# Patient Record
Sex: Female | Born: 1960 | Race: White | Hispanic: No | Marital: Married | Smoking: Never smoker
Health system: Southern US, Community
[De-identification: ages and names within clinical notes are randomized; demographics above are authoritative.]

## PROBLEM LIST (undated history)

## (undated) DIAGNOSIS — B880 Other acariasis: Secondary | ICD-10-CM

## (undated) DIAGNOSIS — M1990 Unspecified osteoarthritis, unspecified site: Secondary | ICD-10-CM

## (undated) DIAGNOSIS — T7840XA Allergy, unspecified, initial encounter: Secondary | ICD-10-CM

## (undated) DIAGNOSIS — E785 Hyperlipidemia, unspecified: Secondary | ICD-10-CM

## (undated) DIAGNOSIS — I7300 Raynaud's syndrome without gangrene: Secondary | ICD-10-CM

## (undated) HISTORY — PX: OTHER SURGICAL HISTORY: SHX169

## (undated) HISTORY — DX: Raynaud's syndrome without gangrene: I73.00

## (undated) HISTORY — PX: DILATION AND CURETTAGE OF UTERUS: SHX78

## (undated) HISTORY — DX: Allergy, unspecified, initial encounter: T78.40XA

## (undated) HISTORY — DX: Hyperlipidemia, unspecified: E78.5

## (undated) HISTORY — PX: WISDOM TOOTH EXTRACTION: SHX21

---

## 1998-08-24 ENCOUNTER — Other Ambulatory Visit: Admission: RE | Admit: 1998-08-24 | Discharge: 1998-08-24 | Payer: Self-pay | Admitting: Gynecology

## 1999-12-06 ENCOUNTER — Other Ambulatory Visit: Admission: RE | Admit: 1999-12-06 | Discharge: 1999-12-06 | Payer: Self-pay | Admitting: Gynecology

## 2000-12-18 ENCOUNTER — Other Ambulatory Visit: Admission: RE | Admit: 2000-12-18 | Discharge: 2000-12-18 | Payer: Self-pay | Admitting: Gynecology

## 2001-12-08 ENCOUNTER — Other Ambulatory Visit: Admission: RE | Admit: 2001-12-08 | Discharge: 2001-12-08 | Payer: Self-pay | Admitting: Gynecology

## 2002-10-21 ENCOUNTER — Other Ambulatory Visit: Admission: RE | Admit: 2002-10-21 | Discharge: 2002-10-21 | Payer: Self-pay | Admitting: *Deleted

## 2003-09-19 ENCOUNTER — Other Ambulatory Visit: Admission: RE | Admit: 2003-09-19 | Discharge: 2003-09-19 | Payer: Self-pay | Admitting: *Deleted

## 2004-10-03 ENCOUNTER — Other Ambulatory Visit: Admission: RE | Admit: 2004-10-03 | Discharge: 2004-10-03 | Payer: Self-pay | Admitting: *Deleted

## 2005-12-04 ENCOUNTER — Other Ambulatory Visit: Admission: RE | Admit: 2005-12-04 | Discharge: 2005-12-04 | Payer: Self-pay | Admitting: *Deleted

## 2006-01-27 ENCOUNTER — Other Ambulatory Visit: Admission: RE | Admit: 2006-01-27 | Discharge: 2006-01-27 | Payer: Self-pay | Admitting: *Deleted

## 2007-01-22 ENCOUNTER — Other Ambulatory Visit: Admission: RE | Admit: 2007-01-22 | Discharge: 2007-01-22 | Payer: Self-pay | Admitting: *Deleted

## 2009-03-29 ENCOUNTER — Ambulatory Visit (HOSPITAL_BASED_OUTPATIENT_CLINIC_OR_DEPARTMENT_OTHER): Admission: RE | Admit: 2009-03-29 | Discharge: 2009-03-29 | Payer: Self-pay | Admitting: Orthopedic Surgery

## 2010-10-26 LAB — POCT PREGNANCY, URINE: Preg Test, Ur: NEGATIVE

## 2010-10-26 LAB — POCT HEMOGLOBIN-HEMACUE: Hemoglobin: 13.8 g/dL (ref 12.0–15.0)

## 2011-09-12 ENCOUNTER — Ambulatory Visit (AMBULATORY_SURGERY_CENTER): Payer: PRIVATE HEALTH INSURANCE | Admitting: *Deleted

## 2011-09-12 VITALS — Ht 64.5 in | Wt 160.0 lb

## 2011-09-12 DIAGNOSIS — Z1211 Encounter for screening for malignant neoplasm of colon: Secondary | ICD-10-CM

## 2011-09-12 MED ORDER — PEG-KCL-NACL-NASULF-NA ASC-C 100 G PO SOLR: ORAL | Status: DC

## 2011-09-26 ENCOUNTER — Encounter: Payer: Self-pay | Admitting: Gastroenterology

## 2011-10-15 ENCOUNTER — Encounter: Payer: Self-pay | Admitting: Gastroenterology

## 2011-10-15 ENCOUNTER — Ambulatory Visit (AMBULATORY_SURGERY_CENTER): Payer: PRIVATE HEALTH INSURANCE | Admitting: Gastroenterology

## 2011-10-15 VITALS — BP 148/90 | HR 79 | Temp 98.1°F | Resp 18 | Ht 64.5 in | Wt 160.0 lb

## 2011-10-15 DIAGNOSIS — D126 Benign neoplasm of colon, unspecified: Secondary | ICD-10-CM

## 2011-10-15 DIAGNOSIS — K5730 Diverticulosis of large intestine without perforation or abscess without bleeding: Secondary | ICD-10-CM

## 2011-10-15 DIAGNOSIS — Z1211 Encounter for screening for malignant neoplasm of colon: Secondary | ICD-10-CM

## 2011-10-15 HISTORY — PX: COLONOSCOPY: SHX174

## 2011-10-15 MED ORDER — SODIUM CHLORIDE 0.9 % IV SOLN: 500.0000 mL | INTRAVENOUS | Status: DC

## 2011-10-15 NOTE — Op Note (Signed)
[facility] [address] [place], [place]  [address]  COLONOSCOPY PROCEDURE REPORT  PATIENT:  Amy Beck, Amy Beck  MR#:  [id number] BIRTHDATE:  1960-10-29, 50 yrs. old  GENDER:  female ENDOSCOPIST:  Barbette Hair. Arlyce Dice, MD REF. BY: PROCEDURE DATE:  10/15/2011 PROCEDURE:  Colonoscopy with snare polypectomy ASA CLASS:  Class I INDICATIONS:  Routine Risk Screening MEDICATIONS:   MAC sedation, administered by CRNA propofol 300mg IV  DESCRIPTION OF PROCEDURE:   After the risks benefits and alternatives of the procedure were thoroughly explained, informed consent was obtained.  Digital rectal exam was performed and revealed no abnormalities.   The LB 180AL [id number] endoscope was introduced through the anus and advanced to the cecum, which was identified by both the appendix and ileocecal valve, without limitations.  The quality of the prep was excellent, using MoviPrep.  The instrument was then slowly withdrawn as the colon was fully examined. <<PROCEDUREIMAGES>>  FINDINGS:  A sessile polyp was found in the cecum. It was 2 mm in size. Polyp was snared without cautery. Retrieval was successful (see image3). snare polyp  Mild diverticulosis was found in the descending colon (see image4).  This was otherwise a normal examination of the colon (see image1, image2, and image5). Retroflexed views in the rectum revealed no abnormalities.    The time to cecum =  1) 3.0  minutes. The scope was then withdrawn in 1) 9.0  minutes from the cecum and the procedure completed. COMPLICATIONS:  None ENDOSCOPIC IMPRESSION: 1) 2 mm sessile polyp in the cecum 2) Mild diverticulosis in the descending colon 3) Otherwise normal examination RECOMMENDATIONS:  1) If the polyp(s) removed today are proven to be adenomatous (pre-cancerous) polyps, you will need a repeat colonoscopy in 5 years. Otherwise you should continue to follow colorectal cancer screening guidelines for "routine risk" patients with  colonosc opy in 10 years. You will receive a letter within 1-2 weeks with the results of your biopsy as well as final recommendations. Please call my office if you have not received a letter after 3 weeks. REPEAT EXAM:  You will receive a letter from Dr. Arlyce Dice in 1-2 weeks, after reviewing the final pathology, with followup recommendations.  ______________________________ Barbette Hair Arlyce Dice, MD  CC:  Morton Stall, MD  n. Rosalie Doctor:   Barbette Hair. Jaylon Boylen at 10/15/2011 09:25 AM  Sindy Guadeloupe, [id number]

## 2011-10-15 NOTE — Patient Instructions (Signed)
YOU HAD AN ENDOSCOPIC PROCEDURE TODAY AT THE [facility]: Refer to the procedure report that was given to you for any specific questions about what was found during the examination.  If the procedure report does not answer your questions, please call your gastroenterologist to clarify.  If you requested that your care partner not be given the details of your procedure findings, then the procedure report has been included in a sealed envelope for you to review at your convenience later.  YOU SHOULD EXPECT: Some feelings of bloating in the abdomen. Passage of more gas than usual.  Walking can help get rid of the air that was put into your GI tract during the procedure and reduce the bloating. If you had a lower endoscopy (such as a colonoscopy or flexible sigmoidoscopy) you may notice spotting of blood in your stool or on the toilet paper. If you underwent a bowel prep for your procedure, then you may not have a normal bowel movement for a few days.  DIET: Your first meal following the procedure should be a light meal and then it is ok to progress to your normal diet.  A half-sandwich or bowl of soup is an example of a good first meal.  Heavy or fried foods are harder to digest and may make you feel nauseous or bloated.  Likewise meals heavy in dairy and vegetables can cause extra gas to form and this can also increase the bloating.  Drink plenty of fluids but you should avoid alcoholic beverages for 24 hours.  ACTIVITY: Your care partner should take you home directly after the procedure.  You should plan to take it easy, moving slowly for the rest of the day.  You can resume normal activity the day after the procedure however you should NOT DRIVE or use heavy machinery for 24 hours (because of the sedation medicines used during the test).    SYMPTOMS TO REPORT IMMEDIATELY: A gastroenterologist can be reached at any hour.  During normal business hours, 8:30 AM to 5:00 PM Monday through Friday,  call [phone].  After hours and on weekends, please call the GI answering service at [phone] who will take a message and have the physician on call contact you.   Following lower endoscopy (colonoscopy or flexible sigmoidoscopy):  Excessive amounts of blood in the stool  Significant tenderness or worsening of abdominal pains  Swelling of the abdomen that is new, acute  Fever of 100F or higher    FOLLOW UP: If any biopsies were taken you will be contacted by phone or by letter within the next 1-3 weeks.  Call your gastroenterologist if you have not heard about the biopsies in 3 weeks.  Our staff will call the home number listed on your records the next business day following your procedure to check on you and address any questions or concerns that you may have at that time regarding the information given to you following your procedure. This is a courtesy call and so if there is no answer at the home number and we have not heard from you through the emergency physician on call, we will assume that you have returned to your regular daily activities without incident.  SIGNATURES/CONFIDENTIALITY: You and/or your care partner have signed paperwork which will be entered into your electronic medical record.  These signatures attest to the fact that that the information above on your After Visit Summary has been reviewed and is understood.  Full responsibility of the confidentiality   of this discharge information lies with you and/or your care-partner.     

## 2011-10-15 NOTE — Progress Notes (Signed)
Patient did not experience any of the following events: a burn prior to discharge; a fall within the facility; wrong site/side/patient/procedure/implant event; or a hospital transfer or hospital admission upon discharge from the facility. (G8907) Patient did not have preoperative order for IV antibiotic SSI prophylaxis. (G8918)  

## 2011-10-16 ENCOUNTER — Telehealth: Payer: Self-pay | Admitting: *Deleted

## 2011-10-16 NOTE — Telephone Encounter (Signed)
  Follow up Call-  Call back number 10/15/2011  Post procedure Call Back phone  # [phone]  Permission to leave phone message Yes     Patient questions:  Do you have a fever, pain , or abdominal swelling? no Pain Score  0 *  Have you tolerated food without any problems? yes  Have you been able to return to your normal activities? yes  Do you have any questions about your discharge instructions: Diet   no Medications  no Follow up visit  no  Do you have questions or concerns about your Care? no  Actions: * If pain score is 4 or above: No action needed, pain <4.

## 2011-10-23 ENCOUNTER — Encounter: Payer: Self-pay | Admitting: Gastroenterology

## 2014-08-31 NOTE — H&P (Signed)
TOTAL HIP ADMISSION H&P  Patient is admitted for right total hip arthroplasty, anterior approach.  Subjective:  Chief Complaint: Right hip primary OA / pain  HPI: Amy Beck, 54 y.o. female, has a history of pain and functional disability in the right hip(s) due to arthritis and patient has failed non-surgical conservative treatments for greater than 12 weeks to include NSAID's and/or analgesics, use of assistive devices and activity modification.  Onset of symptoms was gradual starting 2+ years ago with gradually worsening course since that time.The patient noted no past surgery on the right hip(s).  Patient currently rates pain in the right hip at 10 out of 10 with activity. Patient has night pain, worsening of pain with activity and weight bearing, trendelenberg gait, pain that interfers with activities of daily living, pain with passive range of motion and crepitus. Patient has evidence of periarticular osteophytes and joint space narrowing by imaging studies. This condition presents safety issues increasing the risk of falls.  There is no current active infection.   Risks, benefits and expectations were discussed with the patient.  Risks including but not limited to the risk of anesthesia, blood clots, nerve damage, blood vessel damage, failure of the prosthesis, infection and up to and including death.  Patient understand the risks, benefits and expectations and wishes to proceed with surgery.   PCP: Hermelinda Dellen, MD  D/C Plans:      Home with HHPT  Post-op Meds:       No Rx given   Tranexamic Acid:      To be given - IV  Decadron:      Is to be given  FYI:     ASA post-op  Norco post-op     Past Medical History  Diagnosis Date  . Allergy   . Asthma   . Hyperlipidemia   . Raynaud's disease     Past Surgical History  Procedure Laterality Date  . Knee arthroscopy      both knees  . Dilation and curettage of uterus      No prescriptions prior to admission   Allergies   Allergen Reactions  . Wellbutrin [Bupropion Hcl]     headaches    History  Substance Use Topics  . Smoking status: Never Smoker   . Smokeless tobacco: Never Used  . Alcohol Use: 8.4 oz/week    14 Glasses of wine per week    Family History  Problem Relation Age of Onset  . Esophageal cancer Father   . Pancreatic cancer Paternal Uncle   . Colon cancer Neg Hx   . Stomach cancer Neg Hx   . Rectal cancer Neg Hx      Review of Systems  Constitutional: Negative.   HENT: Negative.   Eyes: Negative.   Respiratory: Negative.   Cardiovascular: Negative.   Gastrointestinal: Negative.   Genitourinary: Negative.   Musculoskeletal: Positive for joint pain.  Skin: Negative.   Neurological: Negative.   Endo/Heme/Allergies: Positive for environmental allergies.  Psychiatric/Behavioral: Negative.     Objective:  Physical Exam  Constitutional: She is oriented to person, place, and time. She appears well-developed and well-nourished.  HENT:  Head: Normocephalic and atraumatic.  Eyes: Pupils are equal, round, and reactive to light.  Neck: Neck supple. No JVD present. No tracheal deviation present. No thyromegaly present.  Cardiovascular: Normal rate, regular rhythm, normal heart sounds and intact distal pulses.   Respiratory: Effort normal and breath sounds normal. No stridor. No respiratory distress. She has no wheezes.  GI: Soft. There is no tenderness. There is no guarding.  Musculoskeletal:       Right hip: She exhibits decreased range of motion, decreased strength, tenderness and bony tenderness. She exhibits no swelling, no deformity and no laceration.  Lymphadenopathy:    She has no cervical adenopathy.  Neurological: She is alert and oriented to person, place, and time.  Skin: Skin is warm and dry.  Psychiatric: She has a normal mood and affect.      Labs:  Estimated body mass index is 27.05 kg/(m^2) as calculated from the following:   Height as of 10/15/11: 5' 4.5"  (1.638 m).   Weight as of 10/15/11: 72.576 kg (160 lb).   Imaging Review Plain radiographs demonstrate severe degenerative joint disease of the right hip(s). The bone quality appears to be good for age and reported activity level.  Assessment/Plan:  End stage arthritis, right hip(s)  The patient history, physical examination, clinical judgement of the provider and imaging studies are consistent with end stage degenerative joint disease of the right hip(s) and total hip arthroplasty is deemed medically necessary. The treatment options including medical management, injection therapy, arthroscopy and arthroplasty were discussed at length. The risks and benefits of total hip arthroplasty were presented and reviewed. The risks due to aseptic loosening, infection, stiffness, dislocation/subluxation,  thromboembolic complications and other imponderables were discussed.  The patient acknowledged the explanation, agreed to proceed with the plan and consent was signed. Patient is being admitted for inpatient treatment for surgery, pain control, PT, OT, prophylactic antibiotics, VTE prophylaxis, progressive ambulation and ADL's and discharge planning.The patient is planning to be discharged home with home health services.     [place] Pugh Analiese Krupka   PA-C  08/31/2014, 12:21 PM

## 2014-09-05 NOTE — Patient Instructions (Addendum)
Amy Beck  09/05/2014   Your procedure is scheduled on:09/13/2014     Report to [facility] Main  Entrance and follow signs to               [place] at     0700 AM.  Call this number if you have problems the morning of surgery [phone]   Remember:  Do not eat food or drink liquids :After Midnight.     Take these medicines the morning of surgery with A SIP OF WATER: none                                You may not have any metal on your body including hair pins and              piercings  Do not wear jewelry,  lotions, powders or perfumes., deodorant.              Do not wear nail polish.  Do not shave  48 hours prior to surgery.     Do not bring valuables to the hospital. [place].  Contacts, dentures or bridgework may not be worn into surgery.  Leave suitcase in the car. After surgery it may be brought to your room.        Special Instructions: coughing and deep breathing exercises, leg exercises               Please read over the following fact sheets you were given: _____________________________________________________________________             [facility] - Preparing for Surgery Before surgery, you can play an important role.  Because skin is not sterile, your skin needs to be as free of germs as possible.  You can reduce the number of germs on your skin by washing with CHG (chlorahexidine gluconate) soap before surgery.  CHG is an antiseptic cleaner which kills germs and bonds with the skin to continue killing germs even after washing. Please DO NOT use if you have an allergy to CHG or antibacterial soaps.  If your skin becomes reddened/irritated stop using the CHG and inform your nurse when you arrive at Short Stay. Do not shave (including legs and underarms) for at least 48 hours prior to the first CHG shower.  You may shave your face/neck. Please follow these instructions  carefully:  1.  Shower with CHG Soap the night before surgery and the  morning of Surgery.  2.  If you choose to wash your hair, wash your hair first as usual with your  normal  shampoo.  3.  After you shampoo, rinse your hair and body thoroughly to remove the  shampoo.                           4.  Use CHG as you would any other liquid soap.  You can apply chg directly  to the skin and wash                       Gently with a scrungie or clean washcloth.  5.  Apply the CHG Soap to your body ONLY FROM THE NECK DOWN.  Do not use on face/ open                           Wound or open sores. Avoid contact with eyes, ears mouth and genitals (private parts).                       Wash face,  Genitals (private parts) with your normal soap.             6.  Wash thoroughly, paying special attention to the area where your surgery  will be performed.  7.  Thoroughly rinse your body with warm water from the neck down.  8.  DO NOT shower/wash with your normal soap after using and rinsing off  the CHG Soap.                9.  Pat yourself dry with a clean towel.            10.  Wear clean pajamas.            11.  Place clean sheets on your bed the night of your first shower and do not  sleep with pets. Day of Surgery : Do not apply any lotions/deodorants the morning of surgery.  Please wear clean clothes to the hospital/surgery center.  FAILURE TO FOLLOW THESE INSTRUCTIONS MAY RESULT IN THE CANCELLATION OF YOUR SURGERY PATIENT SIGNATURE_________________________________  NURSE SIGNATURE__________________________________  ________________________________________________________________________  WHAT IS A BLOOD TRANSFUSION? Blood Transfusion Information  A transfusion is the replacement of blood or some of its parts. Blood is made up of multiple cells which provide different functions.  Red blood cells carry oxygen and are used for blood loss replacement.  White blood cells fight against  infection.  Platelets control bleeding.  Plasma helps clot blood.  Other blood products are available for specialized needs, such as hemophilia or other clotting disorders. BEFORE THE TRANSFUSION  Who gives blood for transfusions?   Healthy volunteers who are fully evaluated to make sure their blood is safe. This is blood bank blood. Transfusion therapy is the safest it has ever been in the practice of medicine. Before blood is taken from a donor, a complete history is taken to make sure that person has no history of diseases nor engages in risky social behavior (examples are intravenous drug use or sexual activity with multiple partners). The donor's travel history is screened to minimize risk of transmitting infections, such as malaria. The donated blood is tested for signs of infectious diseases, such as HIV and hepatitis. The blood is then tested to be sure it is compatible with you in order to minimize the chance of a transfusion reaction. If you or a relative donates blood, this is often done in anticipation of surgery and is not appropriate for emergency situations. It takes many days to process the donated blood. RISKS AND COMPLICATIONS Although transfusion therapy is very safe and saves many lives, the main dangers of transfusion include:  1. Getting an infectious disease. 2. Developing a transfusion reaction. This is an allergic reaction to something in the blood you were given. Every precaution is taken to prevent this. The decision to have a blood transfusion has been considered carefully by your caregiver before blood is given. Blood is not given unless the benefits outweigh the risks. AFTER THE TRANSFUSION  Right after receiving a blood transfusion, you will usually feel much better and more energetic. This is especially  true if your red blood cells have gotten low (anemic). The transfusion raises the level of the red blood cells which carry oxygen, and this usually causes an energy  increase.  The nurse administering the transfusion will monitor you carefully for complications. HOME CARE INSTRUCTIONS  No special instructions are needed after a transfusion. You may find your energy is better. Speak with your caregiver about any limitations on activity for underlying diseases you may have. SEEK MEDICAL CARE IF:   Your condition is not improving after your transfusion.  You develop redness or irritation at the intravenous (IV) site. SEEK IMMEDIATE MEDICAL CARE IF:  Any of the following symptoms occur over the next 12 hours:  Shaking chills.  You have a temperature by mouth above 102 F (38.9 C), not controlled by medicine.  Chest, back, or muscle pain.  People around you feel you are not acting correctly or are confused.  Shortness of breath or difficulty breathing.  Dizziness and fainting.  You get a rash or develop hives.  You have a decrease in urine output.  Your urine turns a dark color or changes to pink, red, or brown. Any of the following symptoms occur over the next 10 days:  You have a temperature by mouth above 102 F (38.9 C), not controlled by medicine.  Shortness of breath.  Weakness after normal activity.  The white part of the eye turns yellow (jaundice).  You have a decrease in the amount of urine or are urinating less often.  Your urine turns a dark color or changes to pink, red, or brown. Document Released: 07/05/2000 Document Revised: 09/30/2011 Document Reviewed: 02/22/2008 [employer] Patient Information 2014 [place].  _______________________________________________________________________  Incentive Spirometer  An incentive spirometer is a tool that can help keep your lungs clear and active. This tool measures how well you are filling your lungs with each breath. Taking long deep breaths may help reverse or decrease the chance of developing breathing (pulmonary) problems (especially infection) following:  A long  period of time when you are unable to move or be active. BEFORE THE PROCEDURE   If the spirometer includes an indicator to show your best effort, your nurse or respiratory therapist will set it to a desired goal.  If possible, sit up straight or lean slightly forward. Try not to slouch.  Hold the incentive spirometer in an upright position. INSTRUCTIONS FOR USE  3. Sit on the edge of your bed if possible, or sit up as far as you can in bed or on a chair. 4. Hold the incentive spirometer in an upright position. 5. Breathe out normally. 6. Place the mouthpiece in your mouth and seal your lips tightly around it. 7. Breathe in slowly and as deeply as possible, raising the piston or the ball toward the top of the column. 8. Hold your breath for 3-5 seconds or for as long as possible. Allow the piston or ball to fall to the bottom of the column. 9. Remove the mouthpiece from your mouth and breathe out normally. 10. Rest for a few seconds and repeat Steps 1 through 7 at least 10 times every 1-2 hours when you are awake. Take your time and take a few normal breaths between deep breaths. 11. The spirometer may include an indicator to show your best effort. Use the indicator as a goal to work toward during each repetition. 12. After each set of 10 deep breaths, practice coughing to be sure your lungs are clear. If you have  an incision (the cut made at the time of surgery), support your incision when coughing by placing a pillow or rolled up towels firmly against it. Once you are able to get out of bed, walk around indoors and cough well. You may stop using the incentive spirometer when instructed by your caregiver.  RISKS AND COMPLICATIONS  Take your time so you do not get dizzy or light-headed.  If you are in pain, you may need to take or ask for pain medication before doing incentive spirometry. It is harder to take a deep breath if you are having pain. AFTER USE  Rest and breathe slowly and  easily.  It can be helpful to keep track of a log of your progress. Your caregiver can provide you with a simple table to help with this. If you are using the spirometer at home, follow these instructions: [place] IF:   You are having difficultly using the spirometer.  You have trouble using the spirometer as often as instructed.  Your pain medication is not giving enough relief while using the spirometer.  You develop fever of 100.5 F (38.1 C) or higher. SEEK IMMEDIATE MEDICAL CARE IF:   You cough up bloody sputum that had not been present before.  You develop fever of 102 F (38.9 C) or greater.  You develop worsening pain at or near the incision site. MAKE SURE YOU:   Understand these instructions.  Will watch your condition.  Will get help right away if you are not doing well or get worse. Document Released: 11/18/2006 Document Revised: 09/30/2011 Document Reviewed: 01/19/2007 [facility] Patient Information 2014 [place], [place].   ________________________________________________________________________

## 2014-09-06 ENCOUNTER — Encounter (HOSPITAL_COMMUNITY)
Admission: RE | Admit: 2014-09-06 | Discharge: 2014-09-06 | Disposition: A | Discharge status: Home / Self Care | Payer: BLUE CROSS/BLUE SHIELD | Source: Ambulatory Visit | Attending: Orthopedic Surgery | Admitting: Orthopedic Surgery

## 2014-09-06 ENCOUNTER — Encounter (HOSPITAL_COMMUNITY): Payer: Self-pay

## 2014-09-06 DIAGNOSIS — Z01818 Encounter for other preprocedural examination: Secondary | ICD-10-CM | POA: Insufficient documentation

## 2014-09-06 DIAGNOSIS — M1611 Unilateral primary osteoarthritis, right hip: Secondary | ICD-10-CM | POA: Diagnosis not present

## 2014-09-06 HISTORY — DX: Unspecified osteoarthritis, unspecified site: M19.90

## 2014-09-06 LAB — URINALYSIS, ROUTINE W REFLEX MICROSCOPIC
Bilirubin Urine: NEGATIVE
GLUCOSE, UA: NEGATIVE mg/dL
Hgb urine dipstick: NEGATIVE
KETONES UR: NEGATIVE mg/dL
LEUKOCYTES UA: NEGATIVE
Nitrite: NEGATIVE
PH: 7 (ref 5.0–8.0)
PROTEIN: NEGATIVE mg/dL
Specific Gravity, Urine: 1.006 (ref 1.005–1.030)
Urobilinogen, UA: 0.2 mg/dL (ref 0.0–1.0)

## 2014-09-06 LAB — CBC
HEMATOCRIT: 45.6 % (ref 36.0–46.0)
HEMOGLOBIN: 15.1 g/dL — AB (ref 12.0–15.0)
MCH: 31.5 pg (ref 26.0–34.0)
MCHC: 33.1 g/dL (ref 30.0–36.0)
MCV: 95 fL (ref 78.0–100.0)
Platelets: 317 10*3/uL (ref 150–400)
RBC: 4.8 MIL/uL (ref 3.87–5.11)
RDW: 13.3 % (ref 11.5–15.5)
WBC: 6.9 10*3/uL (ref 4.0–10.5)

## 2014-09-06 LAB — BASIC METABOLIC PANEL
Anion gap: 9 (ref 5–15)
BUN: 16 mg/dL (ref 6–23)
CO2: 27 mmol/L (ref 19–32)
Calcium: 10 mg/dL (ref 8.4–10.5)
Chloride: 100 mmol/L (ref 96–112)
Creatinine, Ser: 0.6 mg/dL (ref 0.50–1.10)
GFR calc Af Amer: >90 mL/min (ref >90)
GFR calc non Af Amer: >90 mL/min (ref >90)
GLUCOSE: 98 mg/dL (ref 70–99)
POTASSIUM: 4.4 mmol/L (ref 3.5–5.1)
SODIUM: 136 mmol/L (ref 135–145)

## 2014-09-06 LAB — PROTIME-INR
INR: 0.93 (ref 0.00–1.49)
Prothrombin Time: 12.6 seconds (ref 11.6–15.2)

## 2014-09-06 LAB — SURGICAL PCR SCREEN
MRSA, PCR: NEGATIVE
STAPHYLOCOCCUS AUREUS: NEGATIVE

## 2014-09-06 LAB — APTT: APTT: 32 s (ref 24–37)

## 2014-09-06 NOTE — Progress Notes (Signed)
Clearance note from Dr Marveen Reeks on chart- [place] 10/08/13 on chart

## 2014-09-13 ENCOUNTER — Inpatient Hospital Stay (HOSPITAL_COMMUNITY): Payer: BLUE CROSS/BLUE SHIELD | Admitting: Anesthesiology

## 2014-09-13 ENCOUNTER — Inpatient Hospital Stay (HOSPITAL_COMMUNITY)
Admission: RE | Admit: 2014-09-13 | Discharge: 2014-09-14 | DRG: 470 | Disposition: A | Discharge status: Home Health | Payer: BLUE CROSS/BLUE SHIELD | Source: Ambulatory Visit | Attending: Orthopedic Surgery | Admitting: Orthopedic Surgery

## 2014-09-13 ENCOUNTER — Inpatient Hospital Stay (HOSPITAL_COMMUNITY): Payer: BLUE CROSS/BLUE SHIELD

## 2014-09-13 ENCOUNTER — Encounter (HOSPITAL_COMMUNITY): Payer: Self-pay | Admitting: *Deleted

## 2014-09-13 ENCOUNTER — Encounter (HOSPITAL_COMMUNITY)
Admission: RE | Disposition: A | Discharge status: Home Health | Payer: Self-pay | Source: Ambulatory Visit | Attending: Orthopedic Surgery

## 2014-09-13 DIAGNOSIS — E663 Overweight: Secondary | ICD-10-CM | POA: Diagnosis present

## 2014-09-13 DIAGNOSIS — Z6825 Body mass index (BMI) 25.0-25.9, adult: Secondary | ICD-10-CM | POA: Diagnosis not present

## 2014-09-13 DIAGNOSIS — M25551 Pain in right hip: Secondary | ICD-10-CM | POA: Diagnosis present

## 2014-09-13 DIAGNOSIS — Z01812 Encounter for preprocedural laboratory examination: Secondary | ICD-10-CM | POA: Diagnosis not present

## 2014-09-13 DIAGNOSIS — Z96649 Presence of unspecified artificial hip joint: Secondary | ICD-10-CM

## 2014-09-13 DIAGNOSIS — M1611 Unilateral primary osteoarthritis, right hip: Secondary | ICD-10-CM | POA: Diagnosis present

## 2014-09-13 HISTORY — PX: TOTAL HIP ARTHROPLASTY: SHX124

## 2014-09-13 LAB — TYPE AND SCREEN
ABO/RH(D): O POS
Antibody Screen: NEGATIVE

## 2014-09-13 LAB — ABO/RH: ABO/RH(D): O POS

## 2014-09-13 SURGERY — ARTHROPLASTY, HIP, TOTAL, ANTERIOR APPROACH
Anesthesia: Spinal | Site: Hip | Laterality: Right

## 2014-09-13 MED ORDER — DEXAMETHASONE SODIUM PHOSPHATE 10 MG/ML IJ SOLN
10.0000 mg | Freq: Once | INTRAMUSCULAR | Status: AC
  Administered 2014-09-13: 10 mg via INTRAVENOUS

## 2014-09-13 MED ORDER — FENTANYL CITRATE 0.05 MG/ML IJ SOLN
INTRAMUSCULAR | Status: AC
  Filled 2014-09-13: qty 2

## 2014-09-13 MED ORDER — DEXAMETHASONE SODIUM PHOSPHATE 10 MG/ML IJ SOLN
INTRAMUSCULAR | Status: AC
  Filled 2014-09-13: qty 1

## 2014-09-13 MED ORDER — MIDAZOLAM HCL 5 MG/5ML IJ SOLN
INTRAMUSCULAR | Status: DC | PRN
  Administered 2014-09-13 (×2): 1 mg via INTRAVENOUS
  Administered 2014-09-13: 2 mg via INTRAVENOUS

## 2014-09-13 MED ORDER — PROPOFOL 10 MG/ML IV BOLUS
INTRAVENOUS | Status: AC
  Filled 2014-09-13: qty 20

## 2014-09-13 MED ORDER — HYDROMORPHONE HCL 1 MG/ML IJ SOLN: 0.2500 mg | INTRAMUSCULAR | Status: DC | PRN

## 2014-09-13 MED ORDER — METHOCARBAMOL 1000 MG/10ML IJ SOLN
500.0000 mg | Freq: Four times a day (QID) | INTRAVENOUS | Status: DC | PRN
  Filled 2014-09-13: qty 5

## 2014-09-13 MED ORDER — LACTATED RINGERS IV SOLN
INTRAVENOUS | Status: DC | PRN
  Administered 2014-09-13 (×3): via INTRAVENOUS

## 2014-09-13 MED ORDER — PROPOFOL 10 MG/ML IV BOLUS
INTRAVENOUS | Status: DC | PRN
  Administered 2014-09-13: 30 mg via INTRAVENOUS

## 2014-09-13 MED ORDER — MENTHOL 3 MG MT LOZG: 1.0000 | LOZENGE | OROMUCOSAL | Status: DC | PRN

## 2014-09-13 MED ORDER — BISACODYL 10 MG RE SUPP: 10.0000 mg | Freq: Every day | RECTAL | Status: DC | PRN

## 2014-09-13 MED ORDER — DEXAMETHASONE SODIUM PHOSPHATE 10 MG/ML IJ SOLN
10.0000 mg | Freq: Once | INTRAMUSCULAR | Status: DC
  Filled 2014-09-13: qty 1

## 2014-09-13 MED ORDER — DIPHENHYDRAMINE HCL 25 MG PO CAPS: 25.0000 mg | ORAL_CAPSULE | Freq: Four times a day (QID) | ORAL | Status: DC | PRN

## 2014-09-13 MED ORDER — PROPOFOL INFUSION 10 MG/ML OPTIME
INTRAVENOUS | Status: DC | PRN
  Administered 2014-09-13: 160 ug/kg/min via INTRAVENOUS

## 2014-09-13 MED ORDER — PHENYLEPHRINE HCL 10 MG/ML IJ SOLN
10.0000 mg | INTRAVENOUS | Status: DC | PRN
  Administered 2014-09-13: 10 ug/min via INTRAVENOUS

## 2014-09-13 MED ORDER — ALUM & MAG HYDROXIDE-SIMETH 200-200-20 MG/5ML PO SUSP
30.0000 mL | ORAL | Status: DC | PRN
  Administered 2014-09-14: 30 mL via ORAL
  Filled 2014-09-13: qty 30

## 2014-09-13 MED ORDER — FENTANYL CITRATE 0.05 MG/ML IJ SOLN
INTRAMUSCULAR | Status: DC | PRN
  Administered 2014-09-13: 100 ug via INTRAVENOUS

## 2014-09-13 MED ORDER — PHENYLEPHRINE 40 MCG/ML (10ML) SYRINGE FOR IV PUSH (FOR BLOOD PRESSURE SUPPORT)
PREFILLED_SYRINGE | INTRAVENOUS | Status: AC
  Filled 2014-09-13: qty 10

## 2014-09-13 MED ORDER — TRAMADOL HCL 50 MG PO TABS
50.0000 mg | ORAL_TABLET | Freq: Four times a day (QID) | ORAL | Status: DC | PRN
  Administered 2014-09-13 – 2014-09-14 (×3): 50 mg via ORAL
  Filled 2014-09-13 (×3): qty 1

## 2014-09-13 MED ORDER — TRANEXAMIC ACID 100 MG/ML IV SOLN
1000.0000 mg | Freq: Once | INTRAVENOUS | Status: AC
  Administered 2014-09-13: 1000 mg via INTRAVENOUS
  Filled 2014-09-13: qty 10

## 2014-09-13 MED ORDER — OXYMETAZOLINE HCL 0.05 % NA SOLN
1.0000 | Freq: Two times a day (BID) | NASAL | Status: DC
  Filled 2014-09-13: qty 15

## 2014-09-13 MED ORDER — SIMVASTATIN 10 MG PO TABS
10.0000 mg | ORAL_TABLET | Freq: Every day | ORAL | Status: DC
  Administered 2014-09-13: 10 mg via ORAL
  Filled 2014-09-13 (×2): qty 1

## 2014-09-13 MED ORDER — CEFAZOLIN SODIUM-DEXTROSE 2-3 GM-% IV SOLR
2.0000 g | INTRAVENOUS | Status: AC
  Administered 2014-09-13: 2 g via INTRAVENOUS

## 2014-09-13 MED ORDER — CELECOXIB 200 MG PO CAPS
200.0000 mg | ORAL_CAPSULE | Freq: Two times a day (BID) | ORAL | Status: DC
  Administered 2014-09-13 – 2014-09-14 (×2): 200 mg via ORAL
  Filled 2014-09-13 (×3): qty 1

## 2014-09-13 MED ORDER — CEFAZOLIN SODIUM-DEXTROSE 2-3 GM-% IV SOLR
INTRAVENOUS | Status: AC
  Filled 2014-09-13: qty 50

## 2014-09-13 MED ORDER — PHENYLEPHRINE HCL 10 MG/ML IJ SOLN
INTRAMUSCULAR | Status: AC
  Filled 2014-09-13: qty 1

## 2014-09-13 MED ORDER — DOCUSATE SODIUM 100 MG PO CAPS
100.0000 mg | ORAL_CAPSULE | Freq: Two times a day (BID) | ORAL | Status: DC
Start: 2014-09-13 — End: 2014-09-14
  Administered 2014-09-13 – 2014-09-14 (×2): 100 mg via ORAL

## 2014-09-13 MED ORDER — ONDANSETRON HCL 4 MG/2ML IJ SOLN: 4.0000 mg | Freq: Four times a day (QID) | INTRAMUSCULAR | Status: DC | PRN

## 2014-09-13 MED ORDER — CHLORHEXIDINE GLUCONATE 4 % EX LIQD: 60.0000 mL | Freq: Once | CUTANEOUS | Status: DC

## 2014-09-13 MED ORDER — PROMETHAZINE HCL 25 MG/ML IJ SOLN: 6.2500 mg | INTRAMUSCULAR | Status: DC | PRN

## 2014-09-13 MED ORDER — PHENYLEPHRINE HCL 10 MG/ML IJ SOLN
INTRAMUSCULAR | Status: DC | PRN
  Administered 2014-09-13: 120 ug via INTRAVENOUS
  Administered 2014-09-13 (×2): 80 ug via INTRAVENOUS

## 2014-09-13 MED ORDER — FERROUS SULFATE 325 (65 FE) MG PO TABS
325.0000 mg | ORAL_TABLET | Freq: Three times a day (TID) | ORAL | Status: DC
  Administered 2014-09-14 (×2): 325 mg via ORAL
  Filled 2014-09-13 (×4): qty 1

## 2014-09-13 MED ORDER — PHENOL 1.4 % MT LIQD: 1.0000 | OROMUCOSAL | Status: DC | PRN

## 2014-09-13 MED ORDER — ONDANSETRON HCL 4 MG PO TABS: 4.0000 mg | ORAL_TABLET | Freq: Four times a day (QID) | ORAL | Status: DC | PRN

## 2014-09-13 MED ORDER — MIDAZOLAM HCL 2 MG/2ML IJ SOLN
INTRAMUSCULAR | Status: AC
Start: 2014-09-13 — End: 2014-09-13
  Filled 2014-09-13: qty 2

## 2014-09-13 MED ORDER — METOCLOPRAMIDE HCL 10 MG PO TABS: 5.0000 mg | ORAL_TABLET | Freq: Three times a day (TID) | ORAL | Status: DC | PRN

## 2014-09-13 MED ORDER — LACTATED RINGERS IV SOLN
INTRAVENOUS | Status: DC
  Administered 2014-09-13: 1000 mL via INTRAVENOUS

## 2014-09-13 MED ORDER — ASPIRIN EC 325 MG PO TBEC
325.0000 mg | DELAYED_RELEASE_TABLET | Freq: Two times a day (BID) | ORAL | Status: DC
Start: 2014-09-14 — End: 2014-09-14
  Administered 2014-09-14: 325 mg via ORAL
  Filled 2014-09-13 (×3): qty 1

## 2014-09-13 MED ORDER — HYDROCODONE-ACETAMINOPHEN 7.5-325 MG PO TABS
1.0000 | ORAL_TABLET | ORAL | Status: DC
  Administered 2014-09-13: 2 via ORAL
  Filled 2014-09-13: qty 2

## 2014-09-13 MED ORDER — SODIUM CHLORIDE 0.9 % IR SOLN
Status: DC | PRN
  Administered 2014-09-13: 1000 mL

## 2014-09-13 MED ORDER — ACETAMINOPHEN 500 MG PO TABS
1000.0000 mg | ORAL_TABLET | Freq: Three times a day (TID) | ORAL | Status: DC
  Administered 2014-09-13 – 2014-09-14 (×3): 1000 mg via ORAL
  Filled 2014-09-13 (×6): qty 2

## 2014-09-13 MED ORDER — BUPIVACAINE IN DEXTROSE 0.75-8.25 % IT SOLN
INTRATHECAL | Status: DC | PRN
  Administered 2014-09-13: 2 mL via INTRATHECAL

## 2014-09-13 MED ORDER — METOCLOPRAMIDE HCL 5 MG/ML IJ SOLN: 5.0000 mg | Freq: Three times a day (TID) | INTRAMUSCULAR | Status: DC | PRN

## 2014-09-13 MED ORDER — POTASSIUM CHLORIDE 2 MEQ/ML IV SOLN
100.0000 mL/h | INTRAVENOUS | Status: DC
  Administered 2014-09-13 – 2014-09-14 (×2): 100 mL/h via INTRAVENOUS
  Filled 2014-09-13 (×8): qty 1000

## 2014-09-13 MED ORDER — PHENYLEPHRINE 40 MCG/ML (10ML) SYRINGE FOR IV PUSH (FOR BLOOD PRESSURE SUPPORT)
PREFILLED_SYRINGE | INTRAVENOUS | Status: AC
Start: 2014-09-13 — End: 2014-09-13
  Filled 2014-09-13: qty 10

## 2014-09-13 MED ORDER — METHOCARBAMOL 500 MG PO TABS: 500.0000 mg | ORAL_TABLET | Freq: Four times a day (QID) | ORAL | Status: DC | PRN

## 2014-09-13 MED ORDER — CEFAZOLIN SODIUM-DEXTROSE 2-3 GM-% IV SOLR
2.0000 g | Freq: Four times a day (QID) | INTRAVENOUS | Status: AC
  Administered 2014-09-13 (×2): 2 g via INTRAVENOUS
  Filled 2014-09-13 (×2): qty 50

## 2014-09-13 MED ORDER — HYDROMORPHONE HCL 1 MG/ML IJ SOLN
0.5000 mg | INTRAMUSCULAR | Status: DC | PRN
  Administered 2014-09-13: 1 mg via INTRAVENOUS
  Filled 2014-09-13 (×2): qty 1

## 2014-09-13 MED ORDER — POLYETHYLENE GLYCOL 3350 17 G PO PACK
17.0000 g | PACK | Freq: Two times a day (BID) | ORAL | Status: DC
  Administered 2014-09-13 – 2014-09-14 (×2): 17 g via ORAL

## 2014-09-13 MED ORDER — MIDAZOLAM HCL 2 MG/2ML IJ SOLN
INTRAMUSCULAR | Status: AC
  Filled 2014-09-13: qty 2

## 2014-09-13 MED ORDER — MAGNESIUM CITRATE PO SOLN: 1.0000 | Freq: Once | ORAL | Status: AC | PRN

## 2014-09-13 SURGICAL SUPPLY — 42 items
BAG DECANTER FOR FLEXI CONT (MISCELLANEOUS) IMPLANT
BAG SPEC THK2 15X12 ZIP CLS (MISCELLANEOUS)
BAG ZIPLOCK 12X15 (MISCELLANEOUS) IMPLANT
CAPT HIP TOTAL 2 ×3 IMPLANT
COVER PERINEAL POST (MISCELLANEOUS) ×3 IMPLANT
DRAPE C-ARM 42X120 X-RAY (DRAPES) ×3 IMPLANT
DRAPE STERI IOBAN 125X83 (DRAPES) ×3 IMPLANT
DRAPE U-SHAPE 47X51 STRL (DRAPES) ×9 IMPLANT
DRSG AQUACEL AG ADV 3.5X10 (GAUZE/BANDAGES/DRESSINGS) ×3 IMPLANT
DURAPREP 26ML APPLICATOR (WOUND CARE) ×3 IMPLANT
ELECT BLADE TIP CTD 4 INCH (ELECTRODE) ×3 IMPLANT
ELECT PENCIL ROCKER SW 15FT (MISCELLANEOUS) IMPLANT
ELECT REM PT RETURN 15FT ADLT (MISCELLANEOUS) IMPLANT
ELECT REM PT RETURN 9FT ADLT (ELECTROSURGICAL) ×3
ELECTRODE REM PT RTRN 9FT ADLT (ELECTROSURGICAL) ×1 IMPLANT
FACESHIELD WRAPAROUND (MASK) ×12 IMPLANT
GLOVE BIOGEL PI IND STRL 7.5 (GLOVE) ×1 IMPLANT
GLOVE BIOGEL PI IND STRL 8.5 (GLOVE) ×1 IMPLANT
GLOVE BIOGEL PI INDICATOR 7.5 (GLOVE) ×2
GLOVE BIOGEL PI INDICATOR 8.5 (GLOVE) ×2
GLOVE ECLIPSE 8.0 STRL XLNG CF (GLOVE) ×6 IMPLANT
GLOVE ORTHO TXT STRL SZ7.5 (GLOVE) ×3 IMPLANT
GOWN SPEC L3 XXLG W/TWL (GOWN DISPOSABLE) ×3 IMPLANT
GOWN STRL REUS W/TWL LRG LVL3 (GOWN DISPOSABLE) ×3 IMPLANT
HOLDER FOLEY CATH W/STRAP (MISCELLANEOUS) ×3 IMPLANT
KIT BASIN OR (CUSTOM PROCEDURE TRAY) ×3 IMPLANT
LIQUID BAND (GAUZE/BANDAGES/DRESSINGS) ×3 IMPLANT
NDL SAFETY ECLIPSE 18X1.5 (NEEDLE) IMPLANT
NEEDLE HYPO 18GX1.5 SHARP (NEEDLE)
PACK TOTAL JOINT (CUSTOM PROCEDURE TRAY) ×3 IMPLANT
PEN SKIN MARKING BROAD (MISCELLANEOUS) ×3 IMPLANT
SAW OSC TIP CART 19.5X105X1.3 (SAW) ×3 IMPLANT
SUT MNCRL AB 4-0 PS2 18 (SUTURE) ×3 IMPLANT
SUT VIC AB 1 CT1 36 (SUTURE) ×9 IMPLANT
SUT VIC AB 2-0 CT1 27 (SUTURE) ×4
SUT VIC AB 2-0 CT1 TAPERPNT 27 (SUTURE) ×2 IMPLANT
SUT VLOC 180 0 24IN GS25 (SUTURE) ×3 IMPLANT
SYR 50ML LL SCALE MARK (SYRINGE) IMPLANT
TOWEL OR 17X26 10 PK STRL BLUE (TOWEL DISPOSABLE) ×3 IMPLANT
TOWEL OR NON WOVEN STRL DISP B (DISPOSABLE) IMPLANT
TRAY FOLEY CATH 14FRSI W/METER (CATHETERS) ×3 IMPLANT
WATER STERILE IRR 1500ML POUR (IV SOLUTION) ×3 IMPLANT

## 2014-09-13 NOTE — Anesthesia Postprocedure Evaluation (Signed)
  Anesthesia Post-op Note  Patient: Amy Beck  Procedure(s) Performed: Procedure(s) (LRB): RIGHT TOTAL HIP ARTHROPLASTY ANTERIOR APPROACH (Right)  Patient Location: PACU  Anesthesia Type: Spinal  Level of Consciousness: awake and alert   Airway and Oxygen Therapy: Patient Spontanous Breathing  Post-op Pain: mild  Post-op Assessment: Post-op Vital signs reviewed, Patient's Cardiovascular Status Stable, Respiratory Function Stable, Patent Airway and No signs of Nausea or vomiting  Last Vitals:  Filed Vitals:   09/13/14 1600  BP:   Pulse:   Temp:   Resp: 12    Post-op Vital Signs: stable   Complications: No apparent anesthesia complications

## 2014-09-13 NOTE — Evaluation (Signed)
Physical Therapy Evaluation Patient Details Name: Amy Beck MRN: [id number] DOB: [birth year] Today's Date: 09/13/2014   History of Present Illness  54 yo female s/p R THA 09/13/14  Clinical Impression  On eval POd 0, pt required Min assist for mobility-able to ambulate ~90 feet with RW. Pt tolerated activity well. Plan is for d/c home on tomorrow.     Follow Up Recommendations Home health PT    Equipment Recommendations  Rolling walker with 5" wheels    Recommendations for Other Services OT consult     Precautions / Restrictions Precautions Precautions: Fall Restrictions Weight Bearing Restrictions: No RLE Weight Bearing: Weight bearing as tolerated      Mobility  Bed Mobility Overal bed mobility: Needs Assistance Bed Mobility: Supine to Sit;Sit to Supine     Supine to sit: Min assist Sit to supine: Min assist   General bed mobility comments: Assist for R  LE  Transfers Overall transfer level: Needs assistance Equipment used: Rolling walker (2 wheeled) Transfers: Sit to/from Stand Sit to Stand: Min assist         General transfer comment: Assist to rise, stabilize, control descent. VCs safety, technique, hand placement  Ambulation/Gait Ambulation/Gait assistance: Min guard Ambulation Distance (Feet): 90 Feet Assistive device: Rolling walker (2 wheeled) Gait Pattern/deviations: Step-to pattern;Decreased stride length     General Gait Details: close guard for safety. VCs safety, sequence.   Stairs            Wheelchair Mobility    Modified Rankin (Stroke Patients Only)       Balance                                             Pertinent Vitals/Pain Pain Assessment: 0-10 Pain Score: 5  Pain Location: R thigh Pain Descriptors / Indicators: Aching;Tightness;Sore Pain Intervention(s): Monitored during session;Repositioned;Ice applied    Home Living Family/patient expects to be discharged to:: Private residence Living  Arrangements: Spouse/significant other   Type of Home: House Home Access: Stairs to enter Entrance Stairs-Rails: None Entrance Stairs-Number of Steps: 3 Home Layout: Two level;Able to live on main level with bedroom/bathroom Home Equipment: Kasandra Knudsen - single point      Prior Function Level of Independence: Independent               Hand Dominance        Extremity/Trunk Assessment   Upper Extremity Assessment: Defer to OT evaluation           Lower Extremity Assessment: RLE deficits/detail RLE Deficits / Details: moves ankle well.     Cervical / Trunk Assessment: Normal  Communication   Communication: No difficulties  Cognition Arousal/Alertness: Awake/alert Behavior During Therapy: WFL for tasks assessed/performed Overall Cognitive Status: Within Functional Limits for tasks assessed                      General Comments      Exercises        Assessment/Plan    PT Assessment Patient needs continued PT services  PT Diagnosis Difficulty walking;Acute pain   PT Problem List Decreased strength;Decreased range of motion;Decreased activity tolerance;Decreased balance;Decreased mobility;Pain;Decreased knowledge of use of DME  PT Treatment Interventions DME instruction;Gait training;Stair training;Functional mobility training;Therapeutic activities;Therapeutic exercise;Balance training;Patient/family education   PT Goals (Current goals can be found in the Care Plan section) Acute Rehab PT Goals Patient  Stated Goal: regain independence PT Goal Formulation: With patient Time For Goal Achievement: 09/20/14 Potential to Achieve Goals: Good    Frequency 7X/week   Barriers to discharge        Co-evaluation               End of Session   Activity Tolerance: Patient tolerated treatment well Patient left: in bed;with call bell/phone within reach;with family/visitor present           Time: 0165-5374 PT Time Calculation (min) (ACUTE ONLY): 12  min   Charges:   PT Evaluation $Initial PT Evaluation Tier I: 1 Procedure     PT G Codes:        Weston Anna, MPT Pager: [phone]

## 2014-09-13 NOTE — Op Note (Signed)
NAME:  Amy Beck                ACCOUNT NO.: [id number]      MEDICAL RECORD NO.: [id number]      FACILITY:  [facility]      PHYSICIAN:  Paralee Cancel D  DATE OF BIRTH:  1961-05-22     DATE OF PROCEDURE:  09/13/2014                                 OPERATIVE REPORT         PREOPERATIVE DIAGNOSIS: Right  hip osteoarthritis.      POSTOPERATIVE DIAGNOSIS:  Right hip osteoarthritis.      PROCEDURE:  Right total hip replacement through an anterior approach with autograft bone grafting of acetabulum  utilizing DePuy THR system, component size 16mm pinnacle cup, a size 32+4 neutral   Altrex liner, a size 1 Hi Tri Lock stem with a 32+5 delta ceramic   ball.      SURGEON:  Pietro Cassis. Alvan Dame, M.D.      ASSISTANT:  Danae Orleans, PA-C     ANESTHESIA:  Spinal.      SPECIMENS:  None.      COMPLICATIONS:  None.      BLOOD LOSS:  300 cc     DRAINS:  None.      INDICATION OF THE PROCEDURE:  Amy Beck is a 54 y.o. female who had   presented to office for evaluation of right hip pain.  Radiographs revealed   progressive degenerative changes with bone-on-bone   articulation to the  hip joint.  The patient had painful limited range of   motion significantly affecting their overall quality of life.  The patient was failing to    respond to conservative measures, and at this point was ready   to proceed with more definitive measures.  The patient has noted progressive   degenerative changes in his hip, progressive problems and dysfunction   with regarding the hip prior to surgery.  Consent was obtained for   benefit of pain relief.  Specific risk of infection, DVT, component   failure, dislocation, need for revision surgery, as well discussion of   the anterior versus posterior approach were reviewed.  Consent was   obtained for benefit of anterior pain relief through an anterior   approach.      PROCEDURE IN DETAIL:  The patient was brought to operative theater.    Once adequate anesthesia, preoperative antibiotics, 2gm of Ancef, 1gm of Tranexamic Acid, 10 mg of Decadron administered.   The patient was positioned supine on the OSI Hanna table.  Once adequate   padding of boney process was carried out, we had predraped out the hip, and  used fluoroscopy to confirm orientation of the pelvis and position.      The right hip was then prepped and draped from proximal iliac crest to   mid thigh with shower curtain technique.      Time-out was performed identifying the patient, planned procedure, and   extremity.     An incision was then made 2 cm distal and lateral to the   anterior superior iliac spine extending over the orientation of the   tensor fascia lata muscle and sharp dissection was carried down to the   fascia of the muscle and protractor placed in the soft tissues.      The fascia  was then incised.  The muscle belly was identified and swept   laterally and retractor placed along the superior neck.  Following   cauterization of the circumflex vessels and removing some pericapsular   fat, a second cobra retractor was placed on the inferior neck.  A third   retractor was placed on the anterior acetabulum after elevating the   anterior rectus.  A L-capsulotomy was along the line of the   superior neck to the trochanteric fossa, then extended proximally and   distally.  Tag sutures were placed and the retractors were then placed   intracapsular.  We then identified the trochanteric fossa and   orientation of my neck cut, confirmed this radiographically   and then made a neck osteotomy with the femur on traction.  The femoral   head was removed without difficulty or complication.  Traction was let   off and retractors were placed posterior and anterior around the   acetabulum.      The labrum and foveal tissue were debrided.  I began reaming with a 45mm   reamer and reamed up to 68mm reamer with good bony bed preparation and a 32mm   cup was  chosen.  The final 46mm Pinnacle cup was then impacted under fluoroscopy  to confirm the depth of penetration and orientation with respect to   abduction.  A screw was placed followed by the hole eliminator.  The final   32+4 neutral Altrex liner was impacted with good visualized rim fit.  The cup was positioned anatomically within the acetabular portion of the pelvis.      At this point, the femur was rolled at 80 degrees.  Further capsule was   released off the inferior aspect of the femoral neck.  I then   released the superior capsule proximally.  The hook was placed laterally   along the femur and elevated manually and held in position with the bed   hook.  The leg was then extended and adducted with the leg rolled to 100   degrees of external rotation.  Once the proximal femur was fully   exposed, I used a box osteotome to set orientation.  I then began   broaching with the starting chili pepper broach and passed this by hand and then broached up to 1.  With the 1 broach in place I chose a high offset neck and did trial reductions.  The offset was appropriate, leg lengths   appeared to be equal and best re-established with the +5 head ball, confirmed radiographically.   Given these findings, I went ahead and dislocated the hip, repositioned all   retractors and positioned the right hip in the extended and abducted position.  The final 1 Hi Tri Lock stem was   chosen and it was impacted down to the level of neck cut.  Based on this   and the trial reduction, a 32+5 delta ceramic ball was chosen and   impacted onto a clean and dry trunnion, and the hip was reduced.  The   hip had been irrigated throughout the case again at this point.  I did   reapproximate the superior capsular leaflet to the anterior leaflet   using #1 Vicryl.  The fascia of the   tensor fascia lata muscle was then reapproximated using #1 Vicryl and #0 V-lock sutures.  The   remaining wound was closed with 2-0 Vicryl and  running 4-0 Monocryl.   The hip was cleaned, dried,  and dressed sterilely using Dermabond and   Aquacel dressing.  She was then brought   to recovery room in stable condition tolerating the procedure well.    Danae Orleans, PA-C was present for the entirety of the case involved from   preoperative positioning, perioperative retractor management, general   facilitation of the case, as well as primary wound closure as assistant.            Pietro Cassis Alvan Dame, M.D.        09/13/2014 11:10 AM

## 2014-09-13 NOTE — Anesthesia Procedure Notes (Signed)
Spinal  End time: 09/13/2014 9:55 AM Staffing Resident/CRNA: Enrigue Catena E Preanesthetic Checklist Completed: patient identified, site marked, surgical consent, pre-op evaluation, timeout performed, IV checked, risks and benefits discussed and monitors and equipment checked Spinal Block Patient position: sitting Prep: Betadine Patient monitoring: heart rate, continuous pulse ox and blood pressure Approach: right paramedian Location: L3-4 Injection technique: single-shot Needle Needle type: Sprotte  Needle gauge: 24 G Assessment Sensory level: T4 Additional Notes Pt tolerated procedure well.  Spinal kit and drugs within date. CSF flow x3. Negative heme or paresthesia

## 2014-09-13 NOTE — Anesthesia Preprocedure Evaluation (Addendum)
Anesthesia Evaluation  Patient identified by MRN, date of birth, ID band Patient awake    Reviewed: Allergy & Precautions, NPO status , Patient's Chart, lab work & pertinent test results  Airway Mallampati: II  TM Distance: >3 FB Neck ROM: Full    Dental no notable dental hx.    Pulmonary neg pulmonary ROS,  breath sounds clear to auscultation  Pulmonary exam normal       Cardiovascular + Peripheral Vascular Disease Rhythm:Regular Rate:Normal     Neuro/Psych negative neurological ROS  negative psych ROS   GI/Hepatic negative GI ROS, Neg liver ROS,   Endo/Other  negative endocrine ROS  Renal/GU negative Renal ROS  negative genitourinary   Musculoskeletal  (+) Arthritis -,   Abdominal   Peds negative pediatric ROS (+)  Hematology negative hematology ROS (+)   Anesthesia Other Findings   Reproductive/Obstetrics negative OB ROS                             Anesthesia Physical Anesthesia Plan  ASA: II  Anesthesia Plan: Spinal   Post-op Pain Management:    Induction: Intravenous  Airway Management Planned:   Additional Equipment:   Intra-op Plan:   Post-operative Plan: Extubation in OR  Informed Consent: I have reviewed the patients History and Physical, chart, labs and discussed the procedure including the risks, benefits and alternatives for the proposed anesthesia with the patient or authorized representative who has indicated his/her understanding and acceptance.   Dental advisory given  Plan Discussed with: CRNA  Anesthesia Plan Comments: (Discussed spinal and general. She prefers spinal. Discussed risks/benefits of spinal including headache, backache, failure, bleeding, infection, and nerve damage. Patient consents to spinal. Questions answered. Coagulation studies and platelet count acceptable.)       Anesthesia Quick Evaluation

## 2014-09-13 NOTE — Interval H&P Note (Signed)
History and Physical Interval Note:  09/13/2014 7:04 AM  Amy Beck  has presented today for surgery, with the diagnosis of RIGHT HIP OA   The various methods of treatment have been discussed with the patient and family. After consideration of risks, benefits and other options for treatment, the patient has consented to  Procedure(s): RIGHT TOTAL HIP ARTHROPLASTY ANTERIOR APPROACH (Right) as a surgical intervention .  The patient's history has been reviewed, patient examined, no change in status, stable for surgery.  I have reviewed the patient's chart and labs.  Questions were answered to the patient's satisfaction.     [name]

## 2014-09-13 NOTE — Transfer of Care (Signed)
Immediate Anesthesia Transfer of Care Note  Patient: Amy Beck  Procedure(s) Performed: Procedure(s) (LRB): RIGHT TOTAL HIP ARTHROPLASTY ANTERIOR APPROACH (Right)  Patient Location: PACU  Anesthesia Type: Spinal  Level of Consciousness: sedated, patient cooperative and responds to stimulation  Airway & Oxygen Therapy: Patient Spontanous Breathing and Patient connected to face mask oxgen  Post-op Assessment: Report given to PACU RN and Post -op Vital signs reviewed and stable  Post vital signs: Reviewed and stable  Complications: No apparent anesthesia complications

## 2014-09-14 ENCOUNTER — Encounter (HOSPITAL_COMMUNITY): Payer: Self-pay | Admitting: Orthopedic Surgery

## 2014-09-14 DIAGNOSIS — E663 Overweight: Secondary | ICD-10-CM | POA: Diagnosis present

## 2014-09-14 LAB — BASIC METABOLIC PANEL
ANION GAP: 6 (ref 5–15)
BUN: 15 mg/dL (ref 6–23)
CHLORIDE: 107 mmol/L (ref 96–112)
CO2: 27 mmol/L (ref 19–32)
CREATININE: 0.43 mg/dL — AB (ref 0.50–1.10)
Calcium: 8.6 mg/dL (ref 8.4–10.5)
Glucose, Bld: 118 mg/dL — ABNORMAL HIGH (ref 70–99)
Potassium: 3.9 mmol/L (ref 3.5–5.1)
Sodium: 140 mmol/L (ref 135–145)

## 2014-09-14 LAB — CBC
HCT: 34.6 % — ABNORMAL LOW (ref 36.0–46.0)
Hemoglobin: 11.2 g/dL — ABNORMAL LOW (ref 12.0–15.0)
MCH: 30.7 pg (ref 26.0–34.0)
MCHC: 32.4 g/dL (ref 30.0–36.0)
MCV: 94.8 fL (ref 78.0–100.0)
Platelets: 237 10*3/uL (ref 150–400)
RBC: 3.65 MIL/uL — AB (ref 3.87–5.11)
RDW: 13.2 % (ref 11.5–15.5)
WBC: 6.3 10*3/uL (ref 4.0–10.5)

## 2014-09-14 MED ORDER — DOCUSATE SODIUM 100 MG PO CAPS: 100.0000 mg | ORAL_CAPSULE | Freq: Two times a day (BID) | ORAL | Status: DC

## 2014-09-14 MED ORDER — POLYETHYLENE GLYCOL 3350 17 G PO PACK
17.0000 g | PACK | Freq: Two times a day (BID) | ORAL | Status: DC
Start: 2014-09-14 — End: 2015-01-11

## 2014-09-14 MED ORDER — ACETAMINOPHEN 500 MG PO TABS: 1000.0000 mg | ORAL_TABLET | Freq: Three times a day (TID) | ORAL | Status: DC

## 2014-09-14 MED ORDER — FERROUS SULFATE 325 (65 FE) MG PO TABS: 325.0000 mg | ORAL_TABLET | Freq: Three times a day (TID) | ORAL | Status: DC

## 2014-09-14 MED ORDER — TRAMADOL HCL 50 MG PO TABS: 50.0000 mg | ORAL_TABLET | Freq: Four times a day (QID) | ORAL | Status: DC | PRN

## 2014-09-14 MED ORDER — ASPIRIN 325 MG PO TBEC: 325.0000 mg | DELAYED_RELEASE_TABLET | Freq: Two times a day (BID) | ORAL | Status: AC

## 2014-09-14 MED ORDER — METHOCARBAMOL 500 MG PO TABS: 500.0000 mg | ORAL_TABLET | Freq: Four times a day (QID) | ORAL | Status: DC | PRN

## 2014-09-14 NOTE — Evaluation (Signed)
Occupational Therapy Evaluation Patient Details Name: Amy Beck MRN: [id number] DOB: 02/19/1961 Today's Date: 09/14/2014    History of Present Illness 54 yo female s/p R THA 09/13/14   Clinical Impression   Pt was admitted for the above surgery.  Education was completed and no further OT is needed at this time.    Follow Up Recommendations  No OT follow up;Supervision/Assistance - 24 hour (initially)    Equipment Recommendations  None recommended by OT    Recommendations for Other Services       Precautions / Restrictions Precautions Precautions: Fall Restrictions RLE Weight Bearing: Weight bearing as tolerated      Mobility Bed Mobility         Supine to sit: Min assist     General bed mobility comments: Assist for R  LE  Transfers   Equipment used: Rolling walker (2 wheeled) Transfers: Sit to/from Stand Sit to Stand: Min guard         General transfer comment: for safety    Balance                                            ADL Overall ADL's : Needs assistance/impaired     Grooming: Wash/dry hands;Supervision/safety;Standing       Lower Body Bathing: Minimal assistance;Sit to/from stand       Lower Body Dressing: Minimal assistance;Sit to/from stand   Toilet Transfer: Min guard;Ambulation;Comfort height toilet   Toileting- Clothing Manipulation and Hygiene: Supervision/safety;Sitting/lateral lean   Tub/ Shower Transfer: Walk-in shower;Min guard;Ambulation     General ADL Comments: pt will have help as needed for ADLs--not interested in AE. She can complete UB ADLs with set up.   She plans to get a small teak shower seat.  Pt has comfort height commode without anything to push up from.  Practiced this using walker.  Pt initially had "5" pain but felt OK to work with OT before next dose of medication; RN brought during session--pain up to "6" with bathroom transfers and back to "5" once in chair.     Vision      Perception     Praxis      Pertinent Vitals/Pain Pain Score: 6  Pain Location: R thigh Pain Descriptors / Indicators: Aching Pain Intervention(s): Limited activity within patient's tolerance;Monitored during session;Repositioned;RN gave pain meds during session;Ice applied     Hand Dominance     Extremity/Trunk Assessment Upper Extremity Assessment Upper Extremity Assessment: Overall WFL for tasks assessed           Communication Communication Communication: No difficulties   Cognition Arousal/Alertness: Awake/alert Behavior During Therapy: WFL for tasks assessed/performed Overall Cognitive Status: Within Functional Limits for tasks assessed                     General Comments       Exercises       Shoulder Instructions      Home Living Family/patient expects to be discharged to:: Private residence Living Arrangements: Spouse/significant other   Type of Home: House             Bathroom Shower/Tub: Walk-in Corporate treasurer Toilet: Handicapped height     Home Equipment: [place] - single point   Additional Comments: plans to get small teak shower seat      Prior Functioning/Environment Level of Independence: Independent  OT Diagnosis: Acute pain;Generalized weakness   OT Problem List:     OT Treatment/Interventions:      OT Goals(Current goals can be found in the care plan section) Acute Rehab OT Goals Patient Stated Goal: regain independence  OT Frequency:     Barriers to D/C:            Co-evaluation              End of Session    Activity Tolerance: Patient tolerated treatment well Patient left: in chair;with call bell/phone within reach   Time: 0851-0915 OT Time Calculation (min): 24 min Charges:  OT General Charges $OT Visit: 1 Procedure OT Evaluation $Initial OT Evaluation Tier I: 1 Procedure G-Codes:    Amy Beck Oct 14, 2014, 9:35 AM  Amy Beck, OTR/L [phone] 10/14/2014

## 2014-09-14 NOTE — Progress Notes (Signed)
Physical Therapy Treatment Patient Details Name: Amy Beck MRN: [id number] DOB: Aug 12, 1960 Today's Date: 09/14/2014    History of Present Illness 54 yo female s/p R THA 09/13/14    PT Comments    Progressing well with mobility. Gait training and stair negotiation training this session. All education completed. Ready to d/c from PT standpoint.   Follow Up Recommendations  Home health PT     Equipment Recommendations  Rolling walker with 5" wheels    Recommendations for Other Services OT consult     Precautions / Restrictions Precautions Precautions: Fall Restrictions Weight Bearing Restrictions: No RLE Weight Bearing: Weight bearing as tolerated    Mobility  Bed Mobility Overal bed mobility: Needs Assistance Bed Mobility: Supine to Sit;Sit to Supine     Supine to sit: Min guard Sit to supine: Min guard   General bed mobility comments: close guard for safety  Transfers Overall transfer level: Needs assistance Equipment used: Rolling walker (2 wheeled) Transfers: Sit to/from Stand Sit to Stand: Min guard         General transfer comment: close guard for safety  Ambulation/Gait Ambulation/Gait assistance: Min guard Ambulation Distance (Feet): 150 Feet Assistive device: Rolling walker (2 wheeled) Gait Pattern/deviations: Step-to pattern;Step-through pattern;Decreased stride length;Antalgic     General Gait Details: close guard for safety. VCs safety, sequence.    Stairs Stairs: Yes Stairs assistance: Min assist Stair Management: Forwards Number of Stairs: 2 General stair comments: up and over portable steps. 1 HHA for support since pt does not have rail. Explained to pt that she could use her cane  and husband providing 1 HHA as well (only for steps). VCs safety, technique, sequence.   Wheelchair Mobility    Modified Rankin (Stroke Patients Only)       Balance                                    Cognition Arousal/Alertness:  Awake/alert Behavior During Therapy: WFL for tasks assessed/performed Overall Cognitive Status: Within Functional Limits for tasks assessed                      Exercises     General Comments        Pertinent Vitals/Pain Pain Assessment: 0-10 Pain Score: 6  Pain Location: R thigh Pain Descriptors / Indicators: Aching;Sore;Tightness Pain Intervention(s): Monitored during session;Repositioned    Home Living                      Prior Function            PT Goals (current goals can now be found in the care plan section) Progress towards PT goals: Progressing toward goals    Frequency  7X/week    PT Plan Current plan remains appropriate    Co-evaluation             End of Session   Activity Tolerance: Patient tolerated treatment well Patient left: in bed;with call bell/phone within reach;with family/visitor present     Time: 5329-9242 PT Time Calculation (min) (ACUTE ONLY): 9 min  Charges:  $Gait Training: 8-22 mins                    G Codes:      Weston Anna, MPT Pager: [phone]

## 2014-09-14 NOTE — Care Management Note (Signed)
    Page 1 of 2   09/14/2014     10:43:20 AM CARE MANAGEMENT NOTE 09/14/2014  Patient:  Amy Beck, Amy Beck   Account Number:  [id number]  Date Initiated:  09/14/2014  Documentation initiated by:  [facility]  Subjective/Objective Assessment:   adm: RIGHT TOTAL HIP ARTHROPLASTY ANTERIOR APPROACH (Right     Action/Plan:   discharge planning   Anticipated DC Date:  09/14/2014   Anticipated DC Plan:  [place]  CM consult      [facility]  HOME HEALTH   Choice offered to / List presented to:  C-1 Patient   DME arranged  Vassie Moselle      DME agency  [place] arranged  [place]   Status of service:  Completed, signed off Medicare Important Message given?   (If response is "NO", the following Medicare IM given date fields will be blank) Date Medicare IM given:   Medicare IM given by:   Date Additional Medicare IM given:   Additional Medicare IM given by:    Discharge Disposition:  [place]  Per UR Regulation:  Reviewed for med. necessity/level of care/duration of stay  If discussed at [place] of Stay Meetings, dates discussed:    Comments:  09/14/14 08:00 CM met with pt in room to offer choice of home health agency.  Pt chooses [facility] to render HHPT. Address and contact information verified by pt.  CM called [facility] DME rep, Lecretia to please deliver the rolling walker to room prior to discharge.  Referral emailed to [employer], Tim.  No other CM needs were communicated.  Mariane Masters, BSN, CM [phone].

## 2014-09-14 NOTE — Progress Notes (Signed)
Physical Therapy Treatment Patient Details Name: Belita Warsame MRN: [id number] DOB: September 05, 1960 Today's Date: 09/14/2014    History of Present Illness 54 yo female s/p R THA 09/13/14    PT Comments    Progressing well with mobility. Will plan to have a 2nd session to practice steps with husband present.   Follow Up Recommendations  Home health PT     Equipment Recommendations  Rolling walker with 5" wheels    Recommendations for Other Services OT consult     Precautions / Restrictions Precautions Precautions: Fall Restrictions Weight Bearing Restrictions: No RLE Weight Bearing: Weight bearing as tolerated    Mobility  Bed Mobility Overal bed mobility: Needs Assistance Bed Mobility: Sit to Supine     Supine to sit: Min assist Sit to supine: Min assist   General bed mobility comments: Assist for R  LE  Transfers Overall transfer level: Needs assistance Equipment used: Rolling walker (2 wheeled) Transfers: Sit to/from Stand Sit to Stand: Min guard         General transfer comment: close guard for safety  Ambulation/Gait Ambulation/Gait assistance: Min guard Ambulation Distance (Feet): 150 Feet Assistive device: Rolling walker (2 wheeled) Gait Pattern/deviations: Step-to pattern;Step-through pattern;Decreased stride length     General Gait Details: close guard for safety. VCs safety, sequence.    Stairs            Wheelchair Mobility    Modified Rankin (Stroke Patients Only)       Balance                                    Cognition Arousal/Alertness: Awake/alert Behavior During Therapy: WFL for tasks assessed/performed Overall Cognitive Status: Within Functional Limits for tasks assessed                      Exercises Total Joint Exercises Ankle Circles/Pumps: AROM;Both;15 reps;Supine Quad Sets: AROM;Both;15 reps;Supine Heel Slides: AAROM;Right;15 reps;Supine Hip ABduction/ADduction: AAROM;Right;15  reps;Supine    General Comments        Pertinent Vitals/Pain Pain Assessment: 0-10 Pain Score: 7  Pain Location: R thigh Pain Descriptors / Indicators: Aching;Sore;Tightness Pain Intervention(s): Monitored during session;Ice applied;Repositioned    Home Living Family/patient expects to be discharged to:: Private residence Living Arrangements: Spouse/significant other   Type of Home[place] Additional Comments: plans to get small teak shower seat    Prior Function Level of Independence: Independent          PT Goals (current goals can now be found in the care plan section) Acute Rehab PT Goals Patient Stated Goal: regain independence Progress towards PT goals: Progressing toward goals    Frequency  7X/week    PT Plan Current plan remains appropriate    Co-evaluation             End of Session   Activity Tolerance: Patient tolerated treatment well Patient left: in bed;with call bell/phone within reach     Time: 1001-1017 PT Time Calculation (min) (ACUTE ONLY): 16 min  Charges:  $Gait Training: 8-22 mins                    G Codes:      Weston Anna, MPT Pager: [phone]

## 2014-09-14 NOTE — Progress Notes (Signed)
Discharge instructions and prescriptions given to pt with all questions answered.

## 2014-09-14 NOTE — Progress Notes (Signed)
     Subjective: 1 Day Post-Op Procedure(s) (LRB): RIGHT TOTAL HIP ARTHROPLASTY ANTERIOR APPROACH (Right)   Seen by Dr. Alvan Dame. Patient reports pain as mild, pain controlled. No events throughout the night. Ready to be discharged home if she does well with PT and pain stays controlled.  Objective:   VITALS:   Filed Vitals:   09/14/14 0522  BP: 127/92  Pulse: 79  Temp: 98.3 F (36.8 C)  Resp: 16    Dorsiflexion/Plantar flexion intact Incision: dressing C/D/I No cellulitis present Compartment soft  LABS  Recent Labs  09/14/14 0507  HGB 11.2*  HCT 34.6*  WBC 6.3  PLT 237     Recent Labs  09/14/14 0507  NA 140  K 3.9  BUN 15  CREATININE 0.43*  GLUCOSE 118*     Assessment/Plan: 1 Day Post-Op Procedure(s) (LRB): RIGHT TOTAL HIP ARTHROPLASTY ANTERIOR APPROACH (Right) Foley cath d/c'ed Advance diet Up with therapy D/C IV fluids Discharge home with home health  Follow up in 2 weeks at [facility]. Follow up with OLIN,Linton Stolp D in 2 weeks.  Contact information:  [facility] [address] [place] [phone]    Overweight (BMI 25-29.9) Estimated body mass index is 26.76 kg/(m^2) as calculated from the following:   Height as of this encounter: 5\' 4"  (1.626 m).   Weight as of this encounter: 70.761 kg (156 lb). Patient also counseled that weight may inhibit the healing process Patient counseled that losing weight will help with future health issues       West Pugh. Angelia Hazell   PAC  09/14/2014, 8:56 AM

## 2014-09-15 NOTE — Discharge Summary (Signed)
Physician Discharge Summary  Patient ID: Amy Beck MRN: [id number] DOB/AGE: Mar 22, 1961 54 y.o.  Admit date: 09/13/2014 Discharge date: 09/14/2014   Procedures:  Procedure(s) (LRB): RIGHT TOTAL HIP ARTHROPLASTY ANTERIOR APPROACH (Right)  Attending Physician:  Dr. Paralee Cancel   Admission Diagnoses:   Right hip primary OA / pain  Discharge Diagnoses:  Principal Problem:   S/P right THA, AA Active Problems:   Overweight (BMI 25.0-29.9)  Past Medical History  Diagnosis Date  . Allergy   . Hyperlipidemia   . Raynaud's disease   . Arthritis     HPI:    Amy Beck, 54 y.o. female, has a history of pain and functional disability in the right hip(s) due to arthritis and patient has failed non-surgical conservative treatments for greater than 12 weeks to include NSAID's and/or analgesics, use of assistive devices and activity modification. Onset of symptoms was gradual starting 2+ years ago with gradually worsening course since that time.The patient noted no past surgery on the right hip(s). Patient currently rates pain in the right hip at 10 out of 10 with activity. Patient has night pain, worsening of pain with activity and weight bearing, trendelenberg gait, pain that interfers with activities of daily living, pain with passive range of motion and crepitus. Patient has evidence of periarticular osteophytes and joint space narrowing by imaging studies. This condition presents safety issues increasing the risk of falls. There is no current active infection. Risks, benefits and expectations were discussed with the patient. Risks including but not limited to the risk of anesthesia, blood clots, nerve damage, blood vessel damage, failure of the prosthesis, infection and up to and including death. Patient understand the risks, benefits and expectations and wishes to proceed with surgery.    PCP: Hermelinda Dellen, MD   Discharged Condition: good  Hospital Course:  Patient  underwent the above stated procedure on 09/13/2014. Patient tolerated the procedure well and brought to the recovery room in good condition and subsequently to the floor.  POD #1 BP: 127/92 ; Pulse: 79 ; Temp: 98.3 F (36.8 C) ; Resp: 16 Patient reports pain as mild, pain controlled. No events throughout the night. Ready to be discharged home. Dorsiflexion/plantar flexion intact, incision: dressing C/D/I, no cellulitis present and compartment soft.    LABS  Basename    HGB  11.2  HCT  34.6    Discharge Exam: General appearance: alert, cooperative and no distress Extremities: Homans sign is negative, no sign of DVT, no edema, redness or tenderness in the calves or thighs and no ulcers, gangrene or trophic changes  Disposition: Home with follow up in 2 weeks   Follow-up Information    Follow up with [name], MD. Schedule an appointment as soon as possible for a visit in 2 weeks.   Specialty:  Orthopedic Surgery   Contact information:   [address] [place] [address] [phone]       Follow up with [facility].   Why:  home health physical therapy   Contact information:   [place] [address] [phone]       Follow up with [facility].   Why:  rollling walker   Contact information:   [address] [place] [address] [phone]       Discharge Instructions    Call MD / Call 911    Complete by:  As directed   If you experience chest pain or shortness  of breath, CALL 911 and be transported to the hospital emergency room.  If you develope a fever above 101 F, pus (white drainage) or increased drainage or redness at the wound, or calf pain, call your surgeon's office.     Change dressing    Complete by:  As directed   Maintain surgical dressing until follow up in the clinic. If the edges start to pull up, may reinforce with tape. If the dressing is no longer working, may remove  and cover with gauze and tape, but must keep the area dry and clean.  Call with any questions or concerns.     Constipation Prevention    Complete by:  As directed   Drink plenty of fluids.  Prune juice may be helpful.  You may use a stool softener, such as Colace (over the counter) 100 mg twice a day.  Use MiraLax (over the counter) for constipation as needed.     Diet - low sodium heart healthy    Complete by:  As directed      Discharge instructions    Complete by:  As directed   Maintain surgical dressing until follow up in the clinic. If the edges start to pull up, may reinforce with tape. If the dressing is no longer working, may remove and cover with gauze and tape, but must keep the area dry and clean.  Follow up in 2 weeks at [facility] Call with any questions or concerns.     Driving restrictions    Complete by:  As directed   No driving for 4 weeks     Increase activity slowly as tolerated    Complete by:  As directed      TED hose    Complete by:  As directed   Use stockings (TED hose) for 2 weeks on both leg(s).  You may remove them at night for sleeping.     Weight bearing as tolerated    Complete by:  As directed   Laterality:  right  Extremity:  Lower             Medication List    STOP taking these medications        meloxicam 15 MG tablet  Commonly known as:  MOBIC      TAKE these medications        acetaminophen 500 MG tablet  Commonly known as:  TYLENOL  Take 2 tablets (1,000 mg total) by mouth every 8 (eight) hours.     aspirin 325 MG EC tablet  Take 1 tablet (325 mg total) by mouth 2 (two) times daily.     CINNAMON PO  Take 1,000 mg by mouth daily.     docusate sodium 100 MG capsule  Commonly known as:  COLACE  Take 1 capsule (100 mg total) by mouth 2 (two) times daily.     ferrous sulfate 325 (65 FE) MG tablet  Take 1 tablet (325 mg total) by mouth 3 (three) times daily after meals.     FLAX SEED OIL PO  Take 1 capsule by  mouth daily.     HAIR/SKIN/NAILS PO  Take 1 tablet by mouth daily.     KRILL OIL PO  Take 1 capsule by mouth daily.     Linoleic Acid Conjugated 1000 MG Caps  Take 1 capsule by mouth 2 (two) times daily.     magnesium oxide 400 MG tablet  Commonly known as:  MAG-OX  Take 400 mg by  mouth daily.     methocarbamol 500 MG tablet  Commonly known as:  ROBAXIN  Take 1 tablet (500 mg total) by mouth every 6 (six) hours as needed for muscle spasms.     NON FORMULARY  Take 80 mg by mouth daily. Black cohash     NON FORMULARY  Take 1 capsule by mouth daily. Evening primrose oil     oxymetazoline 0.05 % nasal spray  Commonly known as:  AFRIN  Place 1 spray into both nostrils 2 (two) times daily.     phentermine 37.5 MG tablet  Commonly known as:  ADIPEX-P  Take 1 tablet by mouth Daily.     polyethylene glycol packet  Commonly known as:  MIRALAX / GLYCOLAX  Take 17 g by mouth 2 (two) times daily.     PYCNOGENOL PO  Take 25 mg by mouth daily.     simvastatin 10 MG tablet  Commonly known as:  ZOCOR  Take 10 mg by mouth Daily.     traMADol 50 MG tablet  Commonly known as:  ULTRAM  Take 1-2 tablets (50-100 mg total) by mouth every 6 (six) hours as needed for moderate pain or severe pain.         Signed: West Pugh. [place] Sarinana   PA-C  09/15/2014, 8:55 AM

## 2014-12-29 NOTE — H&P (Signed)
TOTAL HIP ADMISSION H&P  Patient is admitted for left total hip arthroplasty, anterior approach.  Subjective:  Chief Complaint:     Left hip primary OA / pain  HPI: Amy Beck, 54 y.o. female, has a history of pain and functional disability in the left hip(s) due to arthritis and patient has failed non-surgical conservative treatments for greater than 12 weeks to include NSAID's and/or analgesics, use of assistive devices and activity modification.  Onset of symptoms was gradual starting 2+ years ago with gradually worsening course since that time.The patient noted prior procedures of the hip to include arthroplasty on the right hip(s).  Patient currently rates pain in the left hip at 9 out of 10 with activity. Patient has night pain, worsening of pain with activity and weight bearing, trendelenberg gait, pain that interfers with activities of daily living and pain with passive range of motion. Patient has evidence of periarticular osteophytes and joint space narrowing by imaging studies. This condition presents safety issues increasing the risk of falls.  There is no current active infection.  Risks, benefits and expectations were discussed with the patient.  Risks including but not limited to the risk of anesthesia, blood clots, nerve damage, blood vessel damage, failure of the prosthesis, infection and up to and including death.  Patient understand the risks, benefits and expectations and wishes to proceed with surgery.   PCP: Hermelinda Dellen, MD  D/C Plans: Home with HHPT  Post-op Meds: No Rx given   Tranexamic Acid: To be given - IV  Decadron: Is to be given  FYI: ASA post-op Norco post-op  Previous procedure components: Right THA, AAutilizing DePuy THR system 31mm pinnacle cup 32+4 neutral Altrex liner 1 Hi Tri Lock stem 32+5 delta ceramic ball   Patient Active Problem List   Diagnosis Date Noted  . Overweight (BMI 25.0-29.9) 09/14/2014   . S/P right THA, AA 09/13/2014   Past Medical History  Diagnosis Date  . Allergy   . Hyperlipidemia   . Raynaud's disease   . Arthritis     Past Surgical History  Procedure Laterality Date  . Knee arthroscopy      both knees  . Dilation and curettage of uterus    . Colonoscopy    . Total hip arthroplasty Right 09/13/2014    Procedure: RIGHT TOTAL HIP ARTHROPLASTY ANTERIOR APPROACH;  Surgeon: [name], MD;  Location: [facility];  Service: Orthopedics;  Laterality: Right;    No prescriptions prior to admission   Allergies  Allergen Reactions  . Magnesium Sulfate In D5w   . Wellbutrin [Bupropion Hcl]     headaches    History  Substance Use Topics  . Smoking status: Never Smoker   . Smokeless tobacco: Never Used  . Alcohol Use: 3.0 - 4.2 oz/week    5-7 Glasses of wine per week    Family History  Problem Relation Age of Onset  . Esophageal cancer Father   . Pancreatic cancer Paternal Uncle   . Colon cancer Neg Hx   . Stomach cancer Neg Hx   . Rectal cancer Neg Hx      Review of Systems  Constitutional: Negative.   HENT: Negative.   Eyes: Negative.   Respiratory: Negative.   Cardiovascular: Negative.   Gastrointestinal: Negative.   Genitourinary: Negative.   Musculoskeletal: Positive for joint pain.  Skin: Negative.   Neurological: Negative.   Endo/Heme/Allergies: Positive for environmental allergies.  Psychiatric/Behavioral: Negative.     Objective:  Physical Exam  Constitutional:  She is oriented to person, place, and time. She appears well-developed and well-nourished.  HENT:  Head: Normocephalic and atraumatic.  Eyes: Pupils are equal, round, and reactive to light.  Neck: Neck supple. No JVD present. No tracheal deviation present. No thyromegaly present.  Cardiovascular: Normal rate, regular rhythm, normal heart sounds and intact distal pulses.   Respiratory: Effort normal and breath sounds normal. No stridor. No respiratory distress. She has no  wheezes.  GI: Soft. There is no tenderness. There is no guarding.  Musculoskeletal:       Left hip: She exhibits decreased range of motion, decreased strength, tenderness and bony tenderness. She exhibits no swelling, no deformity and no laceration.  Lymphadenopathy:    She has no cervical adenopathy.  Neurological: She is alert and oriented to person, place, and time.  Skin: Skin is warm and dry.  Psychiatric: She has a normal mood and affect.     Labs:  Estimated body mass index is 26.76 kg/(m^2) as calculated from the following:   Height as of 09/13/14: 5\' 4"  (1.626 m).   Weight as of 09/06/14: 70.761 kg (156 lb).   Imaging Review Plain radiographs demonstrate severe degenerative joint disease of the left hip(s). The bone quality appears to be good for age and reported activity level.  Assessment/Plan:  End stage arthritis, left hip(s)  The patient history, physical examination, clinical judgement of the provider and imaging studies are consistent with end stage degenerative joint disease of the left hip(s) and total hip arthroplasty is deemed medically necessary. The treatment options including medical management, injection therapy, arthroscopy and arthroplasty were discussed at length. The risks and benefits of total hip arthroplasty were presented and reviewed. The risks due to aseptic loosening, infection, stiffness, dislocation/subluxation,  thromboembolic complications and other imponderables were discussed.  The patient acknowledged the explanation, agreed to proceed with the plan and consent was signed. Patient is being admitted for inpatient treatment for surgery, pain control, PT, OT, prophylactic antibiotics, VTE prophylaxis, progressive ambulation and ADL's and discharge planning.The patient is planning to be discharged home with home health services.    West Pugh Abhiraj Dozal   PA-C  12/29/2014, 10:45 AM

## 2015-01-04 ENCOUNTER — Encounter (HOSPITAL_COMMUNITY): Payer: Self-pay

## 2015-01-05 ENCOUNTER — Encounter (HOSPITAL_COMMUNITY)
Admission: RE | Admit: 2015-01-05 | Discharge: 2015-01-05 | Disposition: A | Discharge status: Home / Self Care | Payer: BLUE CROSS/BLUE SHIELD | Source: Ambulatory Visit | Attending: Orthopedic Surgery | Admitting: Orthopedic Surgery

## 2015-01-05 ENCOUNTER — Encounter (HOSPITAL_COMMUNITY): Payer: Self-pay

## 2015-01-05 DIAGNOSIS — Z01818 Encounter for other preprocedural examination: Secondary | ICD-10-CM | POA: Insufficient documentation

## 2015-01-05 DIAGNOSIS — M1612 Unilateral primary osteoarthritis, left hip: Secondary | ICD-10-CM | POA: Insufficient documentation

## 2015-01-05 HISTORY — DX: Other acariasis: B88.0

## 2015-01-05 LAB — CBC
HCT: 40.9 % (ref 36.0–46.0)
HEMOGLOBIN: 13.3 g/dL (ref 12.0–15.0)
MCH: 30.6 pg (ref 26.0–34.0)
MCHC: 32.5 g/dL (ref 30.0–36.0)
MCV: 94 fL (ref 78.0–100.0)
PLATELETS: 295 10*3/uL (ref 150–400)
RBC: 4.35 MIL/uL (ref 3.87–5.11)
RDW: 13.5 % (ref 11.5–15.5)
WBC: 5.6 10*3/uL (ref 4.0–10.5)

## 2015-01-05 LAB — URINALYSIS, ROUTINE W REFLEX MICROSCOPIC
Bilirubin Urine: NEGATIVE
Glucose, UA: NEGATIVE mg/dL
Hgb urine dipstick: NEGATIVE
Ketones, ur: NEGATIVE mg/dL
LEUKOCYTES UA: NEGATIVE
NITRITE: NEGATIVE
PH: 6 (ref 5.0–8.0)
Protein, ur: NEGATIVE mg/dL
SPECIFIC GRAVITY, URINE: 1.024 (ref 1.005–1.030)
Urobilinogen, UA: 0.2 mg/dL (ref 0.0–1.0)

## 2015-01-05 LAB — PROTIME-INR
INR: 0.99 (ref 0.00–1.49)
Prothrombin Time: 13.3 seconds (ref 11.6–15.2)

## 2015-01-05 LAB — APTT: APTT: 30 s (ref 24–37)

## 2015-01-05 LAB — SURGICAL PCR SCREEN
MRSA, PCR: NEGATIVE
STAPHYLOCOCCUS AUREUS: NEGATIVE

## 2015-01-05 NOTE — Patient Instructions (Addendum)
YOUR PROCEDURE IS SCHEDULED ON : 01/10/15  REPORT TO [place] MAIN ENTRANCE FOLLOW SIGNS TO SHORT STAY CENTER AT :  8:30 AM  CALL THIS NUMBER IF YOU HAVE PROBLEMS THE MORNING OF SURGERY [phone]  REMEMBER:ONLY 1 PER PERSON MAY GO TO SHORT STAY WITH YOU TO GET READY THE MORNING OF YOUR SURGERY  DO NOT EAT FOOD OR DRINK LIQUIDS AFTER MIDNIGHT  TAKE THESE MEDICINES THE MORNING OF SURGERY: MAY TAKE TRAMADOL / TYLENOL IF NEEDED  YOU MAY NOT HAVE ANY METAL ON YOUR BODY INCLUDING HAIR PINS AND PIERCING'S. DO NOT WEAR JEWELRY, MAKEUP, LOTIONS, POWDERS OR PERFUMES. DO NOT WEAR NAIL POLISH. DO NOT SHAVE 48 HRS PRIOR TO SURGERY. MEN MAY SHAVE FACE AND NECK.  DO NOT [place]. [facility] IS NOT RESPONSIBLE FOR VALUABLES.  CONTACTS, DENTURES OR PARTIALS MAY NOT BE WORN TO SURGERY. LEAVE SUITCASE IN CAR. CAN BE BROUGHT TO ROOM AFTER SURGERY.  PATIENTS DISCHARGED THE DAY OF SURGERY WILL NOT BE ALLOWED TO DRIVE HOME.  PLEASE READ OVER THE FOLLOWING INSTRUCTION SHEETS _________________________________________________________________________________                                          [name] - PREPARING FOR SURGERY  Before surgery, you can play an important role.  Because skin is not sterile, your skin needs to be as free of germs as possible.  You can reduce the number of germs on your skin by washing with CHG (chlorahexidine gluconate) soap before surgery.  CHG is an antiseptic cleaner which kills germs and bonds with the skin to continue killing germs even after washing. Please DO NOT use if you have an allergy to CHG or antibacterial soaps.  If your skin becomes reddened/irritated stop using the CHG and inform your nurse when you arrive at Short Stay. Do not shave (including legs and underarms) for at least 48 hours prior to the first CHG shower.  You may shave your face. Please follow these instructions carefully:   1.  Shower with CHG Soap  the night before surgery and the  morning of Surgery.   2.  If you choose to wash your hair, wash your hair first as usual with your  normal  Shampoo.   3.  After you shampoo, rinse your hair and body thoroughly to remove the  shampoo.                                         4.  Use CHG as you would any other liquid soap.  You can apply chg directly  to the skin and wash . Gently wash with scrungie or clean wascloth    5.  Apply the CHG Soap to your body ONLY FROM THE NECK DOWN.   Do not use on open                           Wound or open sores. Avoid contact with eyes, ears mouth and genitals (private parts).                        Genitals (private parts) with your normal soap.  6.  Wash thoroughly, paying special attention to the area where your surgery  will be performed.   7.  Thoroughly rinse your body with warm water from the neck down.   8.  DO NOT shower/wash with your normal soap after using and rinsing off  the CHG Soap .                9.  Pat yourself dry with a clean towel.             10.  Wear clean night clothes to bed after shower             11.  Place clean sheets on your bed the night of your first shower and do not  sleep with pets.  Day of Surgery : Do not apply any lotions/deodorants the morning of surgery.  Please wear clean clothes to the hospital/surgery center.  FAILURE TO FOLLOW THESE INSTRUCTIONS MAY RESULT IN THE CANCELLATION OF YOUR SURGERY    PATIENT SIGNATURE_________________________________  ______________________________________________________________________     Amy Beck  An incentive spirometer is a tool that can help keep your lungs clear and active. This tool measures how well you are filling your lungs with each breath. Taking long deep breaths may help reverse or decrease the chance of developing breathing (pulmonary) problems (especially infection) following:  A long period of time when you are unable to  move or be active. BEFORE THE PROCEDURE   If the spirometer includes an indicator to show your best effort, your nurse or respiratory therapist will set it to a desired goal.  If possible, sit up straight or lean slightly forward. Try not to slouch.  Hold the incentive spirometer in an upright position. INSTRUCTIONS FOR USE   Sit on the edge of your bed if possible, or sit up as far as you can in bed or on a chair.  Hold the incentive spirometer in an upright position.  Breathe out normally.  Place the mouthpiece in your mouth and seal your lips tightly around it.  Breathe in slowly and as deeply as possible, raising the piston or the ball toward the top of the column.  Hold your breath for 3-5 seconds or for as long as possible. Allow the piston or ball to fall to the bottom of the column.  Remove the mouthpiece from your mouth and breathe out normally.  Rest for a few seconds and repeat Steps 1 through 7 at least 10 times every 1-2 hours when you are awake. Take your time and take a few normal breaths between deep breaths.  The spirometer may include an indicator to show your best effort. Use the indicator as a goal to work toward during each repetition.  After each set of 10 deep breaths, practice coughing to be sure your lungs are clear. If you have an incision (the cut made at the time of surgery), support your incision when coughing by placing a pillow or rolled up towels firmly against it. Once you are able to get out of bed, walk around indoors and cough well. You may stop using the incentive spirometer when instructed by your caregiver.  RISKS AND COMPLICATIONS  Take your time so you do not get dizzy or light-headed.  If you are in pain, you may need to take or ask for pain medication before doing incentive spirometry. It is harder to take a deep breath if you are having pain. AFTER USE  Rest and breathe slowly and easily.  It can be helpful to keep track of a log of  your progress. Your caregiver can provide you with a simple table to help with this. If you are using the spirometer at home, follow these instructions: [place] IF:   You are having difficultly using the spirometer.  You have trouble using the spirometer as often as instructed.  Your pain medication is not giving enough relief while using the spirometer.  You develop fever of 100.5 F (38.1 C) or higher. SEEK IMMEDIATE MEDICAL CARE IF:   You cough up bloody sputum that had not been present before.  You develop fever of 102 F (38.9 C) or greater.  You develop worsening pain at or near the incision site. MAKE SURE YOU:   Understand these instructions.  Will watch your condition.  Will get help right away if you are not doing well or get worse. Document Released: 11/18/2006 Document Revised: 09/30/2011 Document Reviewed: 01/19/2007 Exit[employer] Patient Information 2014 [employer], [place].   ________________________________________________________________________  WHAT IS A BLOOD TRANSFUSION? Blood Transfusion Information  A transfusion is the replacement of blood or some of its parts. Blood is made up of multiple cells which provide different functions.  Red blood cells carry oxygen and are used for blood loss replacement.  White blood cells fight against infection.  Platelets control bleeding.  Plasma helps clot blood.  Other blood products are available for specialized needs, such as hemophilia or other clotting disorders. BEFORE THE TRANSFUSION  Who gives blood for transfusions?   Healthy volunteers who are fully evaluated to make sure their blood is safe. This is blood bank blood. Transfusion therapy is the safest it has ever been in the practice of medicine. Before blood is taken from a donor, a complete history is taken to make sure that person has no history of diseases nor engages in risky social behavior (examples are intravenous drug use or sexual activity  with multiple partners). The donor's travel history is screened to minimize risk of transmitting infections, such as malaria. The donated blood is tested for signs of infectious diseases, such as HIV and hepatitis. The blood is then tested to be sure it is compatible with you in order to minimize the chance of a transfusion reaction. If you or a relative donates blood, this is often done in anticipation of surgery and is not appropriate for emergency situations. It takes many days to process the donated blood. RISKS AND COMPLICATIONS Although transfusion therapy is very safe and saves many lives, the main dangers of transfusion include:   Getting an infectious disease.  Developing a transfusion reaction. This is an allergic reaction to something in the blood you were given. Every precaution is taken to prevent this. The decision to have a blood transfusion has been considered carefully by your caregiver before blood is given. Blood is not given unless the benefits outweigh the risks. AFTER THE TRANSFUSION  Right after receiving a blood transfusion, you will usually feel much better and more energetic. This is especially true if your red blood cells have gotten low (anemic). The transfusion raises the level of the red blood cells which carry oxygen, and this usually causes an energy increase.  The nurse administering the transfusion will monitor you carefully for complications. HOME CARE INSTRUCTIONS  No special instructions are needed after a transfusion. You may find your energy is better. Speak with your caregiver about any limitations on activity for underlying diseases you may have. SEEK MEDICAL CARE IF:   Your  condition is not improving after your transfusion.  You develop redness or irritation at the intravenous (IV) site. SEEK IMMEDIATE MEDICAL CARE IF:  Any of the following symptoms occur over the next 12 hours:  Shaking chills.  You have a temperature by mouth above 102 F (38.9  C), not controlled by medicine.  Chest, back, or muscle pain.  People around you feel you are not acting correctly or are confused.  Shortness of breath or difficulty breathing.  Dizziness and fainting.  You get a rash or develop hives.  You have a decrease in urine output.  Your urine turns a dark color or changes to pink, red, or brown. Any of the following symptoms occur over the next 10 days:  You have a temperature by mouth above 102 F (38.9 C), not controlled by medicine.  Shortness of breath.  Weakness after normal activity.  The white part of the eye turns yellow (jaundice).  You have a decrease in the amount of urine or are urinating less often.  Your urine turns a dark color or changes to pink, red, or brown. Document Released: 07/05/2000 Document Revised: 09/30/2011 Document Reviewed: 02/22/2008 [facility] Patient Information 2014 [place], [place].  _______________________________________________________________________

## 2015-01-09 NOTE — Anesthesia Preprocedure Evaluation (Addendum)
Anesthesia Evaluation  Patient identified by MRN, date of birth, ID band Patient awake    Reviewed: Allergy & Precautions, NPO status , Patient's Chart, lab work & pertinent test results  History of Anesthesia Complications Negative for: history of anesthetic complications  Airway Mallampati: II  TM Distance: >3 FB Neck ROM: Full    Dental no notable dental hx. (+) Dental Advisory Given   Pulmonary neg pulmonary ROS,  breath sounds clear to auscultation  Pulmonary exam normal       Cardiovascular + Peripheral Vascular Disease Normal cardiovascular examRhythm:Regular Rate:Normal     Neuro/Psych negative neurological ROS  negative psych ROS   GI/Hepatic negative GI ROS, Neg liver ROS,   Endo/Other  negative endocrine ROS  Renal/GU negative Renal ROS  negative genitourinary   Musculoskeletal  (+) Arthritis -, Raynauds   Abdominal   Peds negative pediatric ROS (+)  Hematology negative hematology ROS (+)   Anesthesia Other Findings   Reproductive/Obstetrics negative OB ROS                             Anesthesia Physical Anesthesia Plan  ASA: II  Anesthesia Plan: Spinal   Post-op Pain Management:    Induction:   Airway Management Planned: Nasal Cannula  Additional Equipment:   Intra-op Plan:   Post-operative Plan:   Informed Consent: I have reviewed the patients History and Physical, chart, labs and discussed the procedure including the risks, benefits and alternatives for the proposed anesthesia with the patient or authorized representative who has indicated his/her understanding and acceptance.   Dental advisory given  Plan Discussed with: CRNA  Anesthesia Plan Comments:         Anesthesia Quick Evaluation

## 2015-01-10 ENCOUNTER — Inpatient Hospital Stay (HOSPITAL_COMMUNITY)
Admission: RE | Admit: 2015-01-10 | Discharge: 2015-01-11 | DRG: 470 | Disposition: A | Discharge status: Home Health | Payer: BLUE CROSS/BLUE SHIELD | Source: Ambulatory Visit | Attending: Orthopedic Surgery | Admitting: Orthopedic Surgery

## 2015-01-10 ENCOUNTER — Inpatient Hospital Stay (HOSPITAL_COMMUNITY): Payer: BLUE CROSS/BLUE SHIELD

## 2015-01-10 ENCOUNTER — Inpatient Hospital Stay (HOSPITAL_COMMUNITY): Payer: BLUE CROSS/BLUE SHIELD | Admitting: Anesthesiology

## 2015-01-10 ENCOUNTER — Encounter (HOSPITAL_COMMUNITY)
Admission: RE | Disposition: A | Discharge status: Home Health | Payer: Self-pay | Source: Ambulatory Visit | Attending: Orthopedic Surgery

## 2015-01-10 ENCOUNTER — Encounter (HOSPITAL_COMMUNITY): Payer: Self-pay

## 2015-01-10 DIAGNOSIS — Z6825 Body mass index (BMI) 25.0-25.9, adult: Secondary | ICD-10-CM | POA: Diagnosis not present

## 2015-01-10 DIAGNOSIS — M1612 Unilateral primary osteoarthritis, left hip: Principal | ICD-10-CM | POA: Diagnosis present

## 2015-01-10 DIAGNOSIS — Z96641 Presence of right artificial hip joint: Secondary | ICD-10-CM | POA: Diagnosis present

## 2015-01-10 DIAGNOSIS — Z96649 Presence of unspecified artificial hip joint: Secondary | ICD-10-CM

## 2015-01-10 DIAGNOSIS — Z01812 Encounter for preprocedural laboratory examination: Secondary | ICD-10-CM

## 2015-01-10 DIAGNOSIS — E663 Overweight: Secondary | ICD-10-CM | POA: Diagnosis present

## 2015-01-10 DIAGNOSIS — M25552 Pain in left hip: Secondary | ICD-10-CM | POA: Diagnosis present

## 2015-01-10 HISTORY — PX: TOTAL HIP ARTHROPLASTY: SHX124

## 2015-01-10 LAB — TYPE AND SCREEN
ABO/RH(D): O POS
ANTIBODY SCREEN: NEGATIVE

## 2015-01-10 SURGERY — ARTHROPLASTY, HIP, TOTAL, ANTERIOR APPROACH
Anesthesia: Spinal | Site: Hip | Laterality: Left

## 2015-01-10 MED ORDER — DEXAMETHASONE SODIUM PHOSPHATE 10 MG/ML IJ SOLN
INTRAMUSCULAR | Status: AC
  Filled 2015-01-10: qty 1

## 2015-01-10 MED ORDER — POLYETHYLENE GLYCOL 3350 17 G PO PACK: 17.0000 g | PACK | Freq: Two times a day (BID) | ORAL | Status: DC

## 2015-01-10 MED ORDER — CEFAZOLIN SODIUM-DEXTROSE 2-3 GM-% IV SOLR
2.0000 g | INTRAVENOUS | Status: AC
  Administered 2015-01-10: 2 g via INTRAVENOUS

## 2015-01-10 MED ORDER — CEFAZOLIN SODIUM-DEXTROSE 2-3 GM-% IV SOLR
2.0000 g | Freq: Four times a day (QID) | INTRAVENOUS | Status: AC
  Administered 2015-01-10 (×2): 2 g via INTRAVENOUS
  Filled 2015-01-10 (×2): qty 50

## 2015-01-10 MED ORDER — FERROUS SULFATE 325 (65 FE) MG PO TABS
325.0000 mg | ORAL_TABLET | Freq: Three times a day (TID) | ORAL | Status: DC
  Administered 2015-01-11: 325 mg via ORAL
  Filled 2015-01-10 (×5): qty 1

## 2015-01-10 MED ORDER — PROPOFOL 10 MG/ML IV BOLUS
INTRAVENOUS | Status: AC
  Filled 2015-01-10: qty 20

## 2015-01-10 MED ORDER — MENTHOL 3 MG MT LOZG: 1.0000 | LOZENGE | OROMUCOSAL | Status: DC | PRN

## 2015-01-10 MED ORDER — CHLORHEXIDINE GLUCONATE 4 % EX LIQD: 60.0000 mL | Freq: Once | CUTANEOUS | Status: DC

## 2015-01-10 MED ORDER — PHENYLEPHRINE 40 MCG/ML (10ML) SYRINGE FOR IV PUSH (FOR BLOOD PRESSURE SUPPORT)
PREFILLED_SYRINGE | INTRAVENOUS | Status: AC
  Filled 2015-01-10: qty 10

## 2015-01-10 MED ORDER — LACTATED RINGERS IV SOLN
INTRAVENOUS | Status: DC
  Administered 2015-01-10 (×2): via INTRAVENOUS
  Administered 2015-01-10: 1000 mL via INTRAVENOUS

## 2015-01-10 MED ORDER — FENTANYL CITRATE (PF) 100 MCG/2ML IJ SOLN
INTRAMUSCULAR | Status: DC | PRN
  Administered 2015-01-10 (×4): 25 ug via INTRAVENOUS

## 2015-01-10 MED ORDER — DOCUSATE SODIUM 100 MG PO CAPS
100.0000 mg | ORAL_CAPSULE | Freq: Two times a day (BID) | ORAL | Status: DC
  Administered 2015-01-10 – 2015-01-11 (×2): 100 mg via ORAL

## 2015-01-10 MED ORDER — HYDROMORPHONE HCL 1 MG/ML IJ SOLN: 0.5000 mg | INTRAMUSCULAR | Status: DC | PRN

## 2015-01-10 MED ORDER — ONDANSETRON HCL 4 MG/2ML IJ SOLN: 4.0000 mg | Freq: Four times a day (QID) | INTRAMUSCULAR | Status: DC | PRN

## 2015-01-10 MED ORDER — METOCLOPRAMIDE HCL 10 MG PO TABS: 5.0000 mg | ORAL_TABLET | Freq: Three times a day (TID) | ORAL | Status: DC | PRN

## 2015-01-10 MED ORDER — PHENYLEPHRINE HCL 10 MG/ML IJ SOLN
INTRAMUSCULAR | Status: DC | PRN
  Administered 2015-01-10 (×3): 40 ug via INTRAVENOUS
  Administered 2015-01-10: 80 ug via INTRAVENOUS
  Administered 2015-01-10 (×2): 40 ug via INTRAVENOUS

## 2015-01-10 MED ORDER — MIDAZOLAM HCL 2 MG/2ML IJ SOLN
INTRAMUSCULAR | Status: AC
  Filled 2015-01-10: qty 2

## 2015-01-10 MED ORDER — FENTANYL CITRATE (PF) 100 MCG/2ML IJ SOLN
INTRAMUSCULAR | Status: AC
  Filled 2015-01-10: qty 2

## 2015-01-10 MED ORDER — DIPHENHYDRAMINE HCL 25 MG PO CAPS: 25.0000 mg | ORAL_CAPSULE | Freq: Four times a day (QID) | ORAL | Status: DC | PRN

## 2015-01-10 MED ORDER — TRANEXAMIC ACID 1000 MG/10ML IV SOLN
1000.0000 mg | Freq: Once | INTRAVENOUS | Status: AC
  Administered 2015-01-10: 1000 mg via INTRAVENOUS
  Filled 2015-01-10: qty 10

## 2015-01-10 MED ORDER — POTASSIUM CHLORIDE 2 MEQ/ML IV SOLN
100.0000 mL/h | INTRAVENOUS | Status: DC
  Administered 2015-01-10 – 2015-01-11 (×2): 100 mL/h via INTRAVENOUS
  Filled 2015-01-10 (×7): qty 1000

## 2015-01-10 MED ORDER — LIDOCAINE HCL (CARDIAC) 20 MG/ML IV SOLN
INTRAVENOUS | Status: AC
  Filled 2015-01-10: qty 5

## 2015-01-10 MED ORDER — PHENOL 1.4 % MT LIQD
1.0000 | OROMUCOSAL | Status: DC | PRN
  Filled 2015-01-10: qty 177

## 2015-01-10 MED ORDER — BISACODYL 10 MG RE SUPP: 10.0000 mg | Freq: Every day | RECTAL | Status: DC | PRN

## 2015-01-10 MED ORDER — ONDANSETRON HCL 4 MG PO TABS: 4.0000 mg | ORAL_TABLET | Freq: Four times a day (QID) | ORAL | Status: DC | PRN

## 2015-01-10 MED ORDER — ALUM & MAG HYDROXIDE-SIMETH 200-200-20 MG/5ML PO SUSP: 30.0000 mL | ORAL | Status: DC | PRN

## 2015-01-10 MED ORDER — MIDAZOLAM HCL 5 MG/5ML IJ SOLN
INTRAMUSCULAR | Status: DC | PRN
  Administered 2015-01-10 (×2): 1 mg via INTRAVENOUS

## 2015-01-10 MED ORDER — METHOCARBAMOL 500 MG PO TABS
500.0000 mg | ORAL_TABLET | Freq: Four times a day (QID) | ORAL | Status: DC | PRN
  Administered 2015-01-10 – 2015-01-11 (×3): 500 mg via ORAL
  Filled 2015-01-10 (×3): qty 1

## 2015-01-10 MED ORDER — EPHEDRINE SULFATE 50 MG/ML IJ SOLN
INTRAMUSCULAR | Status: DC | PRN
  Administered 2015-01-10 (×2): 5 mg via INTRAVENOUS

## 2015-01-10 MED ORDER — BUPIVACAINE HCL (PF) 0.5 % IJ SOLN
INTRAMUSCULAR | Status: DC | PRN
  Administered 2015-01-10: 15 mg

## 2015-01-10 MED ORDER — FENTANYL CITRATE (PF) 100 MCG/2ML IJ SOLN: 25.0000 ug | INTRAMUSCULAR | Status: DC | PRN

## 2015-01-10 MED ORDER — DEXAMETHASONE SODIUM PHOSPHATE 10 MG/ML IJ SOLN
10.0000 mg | Freq: Once | INTRAMUSCULAR | Status: DC
  Filled 2015-01-10: qty 1

## 2015-01-10 MED ORDER — HYDROCODONE-ACETAMINOPHEN 7.5-325 MG PO TABS
1.0000 | ORAL_TABLET | ORAL | Status: DC
  Administered 2015-01-10: 2 via ORAL
  Administered 2015-01-10: 1 via ORAL
  Administered 2015-01-10 – 2015-01-11 (×4): 2 via ORAL
  Filled 2015-01-10 (×3): qty 2
  Filled 2015-01-10: qty 1
  Filled 2015-01-10 (×2): qty 2

## 2015-01-10 MED ORDER — METOCLOPRAMIDE HCL 5 MG/ML IJ SOLN: 5.0000 mg | Freq: Three times a day (TID) | INTRAMUSCULAR | Status: DC | PRN

## 2015-01-10 MED ORDER — LIDOCAINE HCL (CARDIAC) 20 MG/ML IV SOLN
INTRAVENOUS | Status: DC | PRN
  Administered 2015-01-10: 30 mg via INTRAVENOUS

## 2015-01-10 MED ORDER — SIMVASTATIN 20 MG PO TABS
20.0000 mg | ORAL_TABLET | Freq: Every day | ORAL | Status: DC
  Administered 2015-01-10: 20 mg via ORAL
  Filled 2015-01-10 (×2): qty 1

## 2015-01-10 MED ORDER — MAGNESIUM CITRATE PO SOLN: 1.0000 | Freq: Once | ORAL | Status: AC | PRN

## 2015-01-10 MED ORDER — PROPOFOL INFUSION 10 MG/ML OPTIME
INTRAVENOUS | Status: DC | PRN
  Administered 2015-01-10: 120 ug/kg/min via INTRAVENOUS

## 2015-01-10 MED ORDER — CEFAZOLIN SODIUM-DEXTROSE 2-3 GM-% IV SOLR
INTRAVENOUS | Status: AC
  Filled 2015-01-10: qty 50

## 2015-01-10 MED ORDER — DEXAMETHASONE SODIUM PHOSPHATE 10 MG/ML IJ SOLN
10.0000 mg | Freq: Once | INTRAMUSCULAR | Status: AC
  Administered 2015-01-10: 10 mg via INTRAVENOUS

## 2015-01-10 MED ORDER — ASPIRIN EC 325 MG PO TBEC
325.0000 mg | DELAYED_RELEASE_TABLET | Freq: Two times a day (BID) | ORAL | Status: DC
  Administered 2015-01-11: 325 mg via ORAL
  Filled 2015-01-10 (×3): qty 1

## 2015-01-10 MED ORDER — CELECOXIB 200 MG PO CAPS
200.0000 mg | ORAL_CAPSULE | Freq: Two times a day (BID) | ORAL | Status: DC
  Administered 2015-01-10 – 2015-01-11 (×2): 200 mg via ORAL
  Filled 2015-01-10 (×3): qty 1

## 2015-01-10 MED ORDER — ONDANSETRON HCL 4 MG/2ML IJ SOLN: 4.0000 mg | Freq: Once | INTRAMUSCULAR | Status: DC | PRN

## 2015-01-10 MED ORDER — OXYMETAZOLINE HCL 0.05 % NA SOLN
1.0000 | Freq: Two times a day (BID) | NASAL | Status: DC | PRN
  Filled 2015-01-10: qty 15

## 2015-01-10 MED ORDER — DEXTROSE 5 % IV SOLN
500.0000 mg | Freq: Four times a day (QID) | INTRAVENOUS | Status: DC | PRN
  Filled 2015-01-10: qty 5

## 2015-01-10 MED ORDER — SODIUM CHLORIDE 0.9 % IR SOLN
Status: DC | PRN
  Administered 2015-01-10: 1000 mL

## 2015-01-10 SURGICAL SUPPLY — 49 items
BAG DECANTER FOR FLEXI CONT (MISCELLANEOUS) IMPLANT
BAG ZIPLOCK 12X15 (MISCELLANEOUS) ×3 IMPLANT
CAPT HIP TOTAL 2 ×3 IMPLANT
COVER PERINEAL POST (MISCELLANEOUS) ×3 IMPLANT
DRAPE C-ARM 42X120 X-RAY (DRAPES) ×3 IMPLANT
DRAPE STERI IOBAN 125X83 (DRAPES) ×3 IMPLANT
DRAPE U-SHAPE 47X51 STRL (DRAPES) ×9 IMPLANT
DRSG AQUACEL AG ADV 3.5X10 (GAUZE/BANDAGES/DRESSINGS) ×3 IMPLANT
DURAPREP 26ML APPLICATOR (WOUND CARE) ×3 IMPLANT
ELECT BLADE TIP CTD 4 INCH (ELECTRODE) ×3 IMPLANT
ELECT PENCIL ROCKER SW 15FT (MISCELLANEOUS) IMPLANT
ELECT REM PT RETURN 15FT ADLT (MISCELLANEOUS) IMPLANT
ELECT REM PT RETURN 9FT ADLT (ELECTROSURGICAL) ×3
ELECTRODE REM PT RTRN 9FT ADLT (ELECTROSURGICAL) ×1 IMPLANT
FACESHIELD WRAPAROUND (MASK) ×12 IMPLANT
GLOVE BIO SURGEON STRL SZ7.5 (GLOVE) ×3 IMPLANT
GLOVE BIOGEL PI IND STRL 6 (GLOVE) ×1 IMPLANT
GLOVE BIOGEL PI IND STRL 7.0 (GLOVE) ×2 IMPLANT
GLOVE BIOGEL PI IND STRL 7.5 (GLOVE) ×2 IMPLANT
GLOVE BIOGEL PI IND STRL 8.5 (GLOVE) ×1 IMPLANT
GLOVE BIOGEL PI INDICATOR 6 (GLOVE) ×2
GLOVE BIOGEL PI INDICATOR 7.0 (GLOVE) ×4
GLOVE BIOGEL PI INDICATOR 7.5 (GLOVE) ×4
GLOVE BIOGEL PI INDICATOR 8.5 (GLOVE) ×2
GLOVE ECLIPSE 8.0 STRL XLNG CF (GLOVE) ×6 IMPLANT
GLOVE ORTHO TXT STRL SZ7.5 (GLOVE) ×3 IMPLANT
GLOVE SURG SS PI 7.0 STRL IVOR (GLOVE) ×6 IMPLANT
GOWN SPEC L3 MED W/TWL (GOWN DISPOSABLE) ×3 IMPLANT
GOWN SPEC L3 XXLG W/TWL (GOWN DISPOSABLE) ×3 IMPLANT
GOWN STRL REUS W/TWL LRG LVL3 (GOWN DISPOSABLE) ×9 IMPLANT
HOLDER FOLEY CATH W/STRAP (MISCELLANEOUS) ×3 IMPLANT
KIT BASIN OR (CUSTOM PROCEDURE TRAY) ×3 IMPLANT
LIQUID BAND (GAUZE/BANDAGES/DRESSINGS) ×3 IMPLANT
NDL SAFETY ECLIPSE 18X1.5 (NEEDLE) IMPLANT
NEEDLE HYPO 18GX1.5 SHARP (NEEDLE)
PACK TOTAL JOINT (CUSTOM PROCEDURE TRAY) ×3 IMPLANT
PEN SKIN MARKING BROAD (MISCELLANEOUS) ×3 IMPLANT
SAW OSC TIP CART 19.5X105X1.3 (SAW) ×3 IMPLANT
SUT MNCRL AB 4-0 PS2 18 (SUTURE) ×3 IMPLANT
SUT VIC AB 1 CT1 36 (SUTURE) ×9 IMPLANT
SUT VIC AB 2-0 CT1 27 (SUTURE) ×4
SUT VIC AB 2-0 CT1 TAPERPNT 27 (SUTURE) ×2 IMPLANT
SUT VLOC 180 0 24IN GS25 (SUTURE) ×3 IMPLANT
SYR 50ML LL SCALE MARK (SYRINGE) IMPLANT
TOWEL OR 17X26 10 PK STRL BLUE (TOWEL DISPOSABLE) ×3 IMPLANT
TOWEL OR NON WOVEN STRL DISP B (DISPOSABLE) ×3 IMPLANT
TRAY FOLEY W/METER SILVER 14FR (SET/KITS/TRAYS/PACK) ×3 IMPLANT
WATER STERILE IRR 1500ML POUR (IV SOLUTION) ×3 IMPLANT
YANKAUER SUCT BULB TIP 10FT TU (MISCELLANEOUS) ×3 IMPLANT

## 2015-01-10 NOTE — Interval H&P Note (Signed)
History and Physical Interval Note:  01/10/2015 10:02 AM  Amy Beck  has presented today for surgery, with the diagnosis of LEFT HIP OA  The various methods of treatment have been discussed with the patient and family. After consideration of risks, benefits and other options for treatment, the patient has consented to  Procedure(s): LEFT TOTAL HIP ARTHROPLASTY ANTERIOR APPROACH  (Left) as a surgical intervention .  The patient's history has been reviewed, patient examined, no change in status, stable for surgery.  I have reviewed the patient's chart and labs.  Questions were answered to the patient's satisfaction.     [name]

## 2015-01-10 NOTE — Anesthesia Procedure Notes (Signed)
Spinal  Start time: 01/10/2015 11:22 AM End time: 01/10/2015 11:29 AM Staffing Resident/CRNA: Sherian Maroon A Performed by: resident/CRNA  Preanesthetic Checklist Completed: patient identified, site marked, surgical consent, pre-op evaluation, timeout performed, IV checked, risks and benefits discussed and monitors and equipment checked Spinal Block Patient position: sitting Prep: Betadine Patient monitoring: heart rate, cardiac monitor, continuous pulse ox and blood pressure Approach: midline Location: L3-4 Injection technique: single-shot Needle Needle type: Sprotte  Needle gauge: 24 G Needle length: 9 cm Needle insertion depth: 4 cm Assessment Sensory level: T6

## 2015-01-10 NOTE — Op Note (Signed)
NAME:  Amy Beck                ACCOUNT NO.: [id number]      MEDICAL RECORD NO.: [id number]      FACILITY:  [facility]      PHYSICIAN:  Paralee Cancel D  DATE OF BIRTH:  May 26, 1961     DATE OF PROCEDURE:  01/10/2015                                 OPERATIVE REPORT         PREOPERATIVE DIAGNOSIS: Left  hip osteoarthritis.      POSTOPERATIVE DIAGNOSIS:  Left hip osteoarthritis.      PROCEDURE:  Left total hip replacement through an anterior approach   utilizing DePuy THR system, component size 42mm pinnacle cup, a size 32+4 neutral   Altrex liner, a size 5 Hi Tri Lock stem with a 32+1 delta ceramic   ball.      SURGEON:  Pietro Cassis. Alvan Dame, M.D.      ASSISTANT:  Danae Orleans, PA-C     ANESTHESIA:  Spinal.      SPECIMENS:  None.      COMPLICATIONS:  None.      BLOOD LOSS:  550 cc     DRAINS:  None.      INDICATION OF THE PROCEDURE:  Amy Beck is a 54 y.o. female who had   presented to office for evaluation of left hip pain.  Radiographs revealed   progressive degenerative changes with bone-on-bone   articulation to the  hip joint.  The patient had painful limited range of   motion significantly affecting their overall quality of life.  The patient was failing to    respond to conservative measures, and at this point was ready   to proceed with more definitive measures.  The patient has noted progressive   degenerative changes in his hip, progressive problems and dysfunction   with regarding the hip prior to surgery.  Consent was obtained for   benefit of pain relief.  Specific risk of infection, DVT, component   failure, dislocation, need for revision surgery, as well discussion of   the anterior versus posterior approach were reviewed.  Consent was   obtained for benefit of anterior pain relief through an anterior   approach.      PROCEDURE IN DETAIL:  The patient was brought to operative theater.   Once adequate anesthesia, preoperative  antibiotics, 2gm of Ancef. 1gm of Tranexamic Acid, and 10mg  of Decadron administered.   The patient was positioned supine on the OSI Hanna table.  Once adequate   padding of boney process was carried out, we had predraped out the hip, and  used fluoroscopy to confirm orientation of the pelvis and position.      The left hip was then prepped and draped from proximal iliac crest to   mid thigh with shower curtain technique.      Time-out was performed identifying the patient, planned procedure, and   extremity.     An incision was then made 2 cm distal and lateral to the   anterior superior iliac spine extending over the orientation of the   tensor fascia lata muscle and sharp dissection was carried down to the   fascia of the muscle and protractor placed in the soft tissues.      The fascia was then incised.  The  muscle belly was identified and swept   laterally and retractor placed along the superior neck.  Following   cauterization of the circumflex vessels and removing some pericapsular   fat, a second cobra retractor was placed on the inferior neck.  A third   retractor was placed on the anterior acetabulum after elevating the   anterior rectus.  A L-capsulotomy was along the line of the   superior neck to the trochanteric fossa, then extended proximally and   distally.  Tag sutures were placed and the retractors were then placed   intracapsular.  We then identified the trochanteric fossa and   orientation of my neck cut, confirmed this radiographically   and then made a neck osteotomy with the femur on traction.  The femoral   head was removed without difficulty or complication.  Traction was let   off and retractors were placed posterior and anterior around the   acetabulum.      The labrum and foveal tissue were debrided.  I began reaming with a 69mm   reamer and reamed up to 98mm reamer with good bony bed preparation and a 7mm   cup was chosen.  The final 80mm Pinnacle cup  was then impacted under fluoroscopy  to confirm the depth of penetration and orientation with respect to   abduction.  A screw was placed followed by the hole eliminator.  The final   32+4 neutral Altrex liner was impacted with good visualized rim fit.  The cup was positioned anatomically within the acetabular portion of the pelvis.      At this point, the femur was rolled at 80 degrees.  Further capsule was   released off the inferior aspect of the femoral neck.  I then   released the superior capsule proximally.  The hook was placed laterally   along the femur and elevated manually and held in position with the bed   hook.  The leg was then extended and adducted with the leg rolled to 100   degrees of external rotation.  Once the proximal femur was fully   exposed, I used a box osteotome to set orientation.  I then began   broaching with the starting chili pepper broach and passed this by hand and then broached up to 5.  With the 5 broach in place I chose a high offset neck and did a trial reduction.  The offset was appropriate, leg lengths   appeared to be equal, confirmed radiographically.   Given these findings, I went ahead and dislocated the hip, repositioned all   retractors and positioned the right hip in the extended and abducted position.  The final 5 hi Tri Lock stem was   chosen and it was impacted down to the level of neck cut.  Based on this   and the trial reduction, a 32+1 delta ceramic ball was chosen and   impacted onto a clean and dry trunnion, and the hip was reduced.  The   hip had been irrigated throughout the case again at this point.  I did   reapproximate the superior capsular leaflet to the anterior leaflet   using #1 Vicryl.  The fascia of the   tensor fascia lata muscle was then reapproximated using #1 Vicryl and #0 V-lock sutures.  The   remaining wound was closed with 2-0 Vicryl and running 4-0 Monocryl.   The hip was cleaned, dried, and dressed sterilely using  Dermabond and   Aquacel dressing.  She  was then brought   to recovery room in stable condition tolerating the procedure well.    Danae Orleans, PA-C was present for the entirety of the case involved from   preoperative positioning, perioperative retractor management, general   facilitation of the case, as well as primary wound closure as assistant.            Pietro Cassis Alvan Dame, M.D.        01/10/2015 12:53 PM

## 2015-01-10 NOTE — Transfer of Care (Signed)
Immediate Anesthesia Transfer of Care Note  Patient: Amy Beck  Procedure(s) Performed: Procedure(s): LEFT TOTAL HIP ARTHROPLASTY ANTERIOR APPROACH  (Left)  Patient Location: PACU  Anesthesia Type:Spinal  Level of Consciousness: awake, alert , oriented and patient cooperative  Airway & Oxygen Therapy: Patient Spontanous Breathing and Patient connected to face mask oxygen  Post-op Assessment: Report given to RN and Post -op Vital signs reviewed and stable  Post vital signs: Reviewed and stable  Last Vitals:  Filed Vitals:   01/10/15 1315  BP: 100/59  Pulse: 74  Temp:   Resp: 14    Complications: No apparent anesthesia complications

## 2015-01-10 NOTE — Anesthesia Postprocedure Evaluation (Signed)
  Anesthesia Post-op Note  Patient: Amy Beck  Procedure(s) Performed: Procedure(s) (LRB): LEFT TOTAL HIP ARTHROPLASTY ANTERIOR APPROACH  (Left)  Patient Location: PACU  Anesthesia Type: Spinal  Level of Consciousness: awake and alert   Airway and Oxygen Therapy: Patient Spontanous Breathing  Post-op Pain: mild  Post-op Assessment: Post-op Vital signs reviewed, Patient's Cardiovascular Status Stable, Respiratory Function Stable, Patent Airway and No signs of Nausea or vomiting  Last Vitals:  Filed Vitals:   01/10/15 1445  BP: 120/76  Pulse: 64  Temp: 36.8 C  Resp: 14    Post-op Vital Signs: stable   Complications: No apparent anesthesia complications

## 2015-01-11 ENCOUNTER — Encounter (HOSPITAL_COMMUNITY): Payer: Self-pay | Admitting: Orthopedic Surgery

## 2015-01-11 LAB — CBC
HCT: 32.2 % — ABNORMAL LOW (ref 36.0–46.0)
Hemoglobin: 10.4 g/dL — ABNORMAL LOW (ref 12.0–15.0)
MCH: 30.2 pg (ref 26.0–34.0)
MCHC: 32.3 g/dL (ref 30.0–36.0)
MCV: 93.6 fL (ref 78.0–100.0)
PLATELETS: 240 10*3/uL (ref 150–400)
RBC: 3.44 MIL/uL — AB (ref 3.87–5.11)
RDW: 13.5 % (ref 11.5–15.5)
WBC: 6.4 10*3/uL (ref 4.0–10.5)

## 2015-01-11 LAB — BASIC METABOLIC PANEL
Anion gap: 5 (ref 5–15)
BUN: 17 mg/dL (ref 6–20)
CHLORIDE: 107 mmol/L (ref 101–111)
CO2: 25 mmol/L (ref 22–32)
CREATININE: 0.54 mg/dL (ref 0.44–1.00)
Calcium: 8.5 mg/dL — ABNORMAL LOW (ref 8.9–10.3)
GFR calc Af Amer: >60 mL/min (ref >60)
Glucose, Bld: 118 mg/dL — ABNORMAL HIGH (ref 65–99)
Potassium: 3.7 mmol/L (ref 3.5–5.1)
Sodium: 137 mmol/L (ref 135–145)

## 2015-01-11 MED ORDER — HYDROCODONE-ACETAMINOPHEN 7.5-325 MG PO TABS: 1.0000 | ORAL_TABLET | Freq: Three times a day (TID) | ORAL | Status: DC | PRN

## 2015-01-11 MED ORDER — POLYETHYLENE GLYCOL 3350 17 G PO PACK: 17.0000 g | PACK | Freq: Two times a day (BID) | ORAL | Status: DC

## 2015-01-11 MED ORDER — METHOCARBAMOL 500 MG PO TABS: 500.0000 mg | ORAL_TABLET | Freq: Four times a day (QID) | ORAL | Status: DC | PRN

## 2015-01-11 MED ORDER — TRAMADOL HCL 50 MG PO TABS: 50.0000 mg | ORAL_TABLET | Freq: Four times a day (QID) | ORAL | Status: DC | PRN

## 2015-01-11 MED ORDER — FERROUS SULFATE 325 (65 FE) MG PO TABS: 325.0000 mg | ORAL_TABLET | Freq: Three times a day (TID) | ORAL | Status: DC

## 2015-01-11 MED ORDER — ASPIRIN 325 MG PO TBEC: 325.0000 mg | DELAYED_RELEASE_TABLET | Freq: Two times a day (BID) | ORAL | Status: AC

## 2015-01-11 MED ORDER — DOCUSATE SODIUM 100 MG PO CAPS: 100.0000 mg | ORAL_CAPSULE | Freq: Two times a day (BID) | ORAL | Status: DC

## 2015-01-11 NOTE — Evaluation (Signed)
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  Pt will benefit from skilled PT to increase their independence and safety with mobility to allow discharge to the venue listed below.  Pt mobilizing very well POD #1 and will likely d/c home later today after next session.  Pt reports her husband will assist at home if needed.        Follow Up Recommendations Home health PT    Equipment Recommendations  None recommended by PT    Recommendations for Other Services       Precautions / Restrictions Precautions Precautions: None Restrictions Weight Bearing Restrictions: No Other Position/Activity Restrictions: WBAT      Mobility  Bed Mobility Overal bed mobility: Needs Assistance Bed Mobility: Supine to Sit     Supine to sit: Supervision     General bed mobility comments: pt self assisted L LE over EOB  Transfers Overall transfer level: Needs assistance Equipment used: Rolling walker (2 wheeled) Transfers: Sit to/from Stand Sit to Stand: Supervision         General transfer comment: verbal cues for hand placement   Ambulation/Gait Ambulation/Gait assistance: Supervision Ambulation Distance (Feet): 80 Feet Assistive device: Rolling walker (2 wheeled) Gait Pattern/deviations: Step-through pattern;Decreased stance time - left;Decreased step length - right;Antalgic     General Gait Details: verbal cues for sequence, step length  Stairs            Wheelchair Mobility    Modified Rankin (Stroke Patients Only)       Balance                                             Pertinent Vitals/Pain Pain Assessment: 0-10 Pain Score: 3  Pain Location: L  hip Pain Descriptors / Indicators: Aching;Sore Pain Intervention(s): Limited activity within patient's tolerance;Monitored during session;Repositioned    Home Living Family/patient expects to be discharged to:: Private residence Living Arrangements: Spouse/significant other   Type of Home: House Home Access: Stairs to enter Entrance Stairs-Rails: None Entrance Stairs-Number of Steps: 3 Home Layout: Two level;Able to live on main level with bedroom/bathroom Home Equipment: Kasandra Knudsen - single point;Walker - 2 wheels      Prior Function Level of Independence: Independent with assistive device(s)         Comments: has been using SPC for stairs     Hand Dominance        Extremity/Trunk Assessment               Lower Extremity Assessment: LLE deficits/detail   LLE Deficits / Details: decreased functional hip strength observed     Communication   Communication: No difficulties  Cognition Arousal/Alertness: Awake/alert Behavior During Therapy: WFL for tasks assessed/performed Overall Cognitive Status: Within Functional Limits for tasks assessed                      General Comments      Exercises        Assessment/Plan    PT Assessment Patient needs continued PT services  PT Diagnosis Difficulty walking;Acute pain  PT Problem List Decreased strength;Decreased activity tolerance;Decreased mobility;Pain  PT Treatment Interventions Functional mobility training;Stair training;Gait training;DME instruction;Patient/family education;Therapeutic activities;Therapeutic exercise   PT Goals (Current goals can be found in the Care Plan section) Acute Rehab PT Goals PT Goal Formulation: With patient Time For Goal Achievement: 01/14/15 Potential to Achieve Goals: Good    Frequency 7X/week   Barriers to discharge        Co-evaluation               End of Session   Activity Tolerance: Patient tolerated treatment well Patient left: in chair;with call  bell/phone within reach           Time: 0853-0906 PT Time Calculation (min) (ACUTE ONLY): 13 min   Charges:   PT Evaluation $Initial PT Evaluation Tier I: 1 Procedure     PT G Codes:        Jerimy Johanson,KATHrine E 01/11/2015, 9:12 AM Carmelia Bake, PT, DPT 01/11/2015 Pager: [phone]

## 2015-01-11 NOTE — Discharge Instructions (Signed)

## 2015-01-11 NOTE — Progress Notes (Signed)
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  Pt had no questions/concerns.  Precautions  Precautions None  Restrictions  Other Position/Activity Restrictions WBAT  Pain Assessment  Pain Assessment 0-10  Pain Score 1  Pain Location L hip  Pain Descriptors / Indicators Aching;Sore  Pain Intervention(s) Repositioned;Limited activity within patient's tolerance;Monitored during session  Cognition  Arousal/Alertness Awake/alert  Behavior During Therapy WFL for tasks assessed/performed  Overall Cognitive Status Within Functional Limits for tasks assessed  Transfers  Overall transfer level Modified independent  Ambulation/Gait  Ambulation/Gait assistance Supervision  Ambulation Distance (Feet) 80 Feet  Assistive device Rolling walker (2 wheeled)  General Gait Details verbal cues for RW distance, posture  Gait Pattern/deviations Step-through pattern;Antalgic  Stairs Yes  Stairs assistance Min guard  Stair Management One rail Left;Step to pattern;Forwards;With cane  Number of Stairs 2  General stair comments verbal cues for safe technique and sequence, pt does not have rail but states she can hold wall, also encouraged spouse assist for free hand, pt has been using SPC prior to surgery and feels comfortable   PT - End of Session  Activity Tolerance Patient tolerated treatment well  Patient left in bed;with call bell/phone within reach  PT - Assessment/Plan  PT Plan Current plan remains appropriate  PT Frequency (ACUTE ONLY) 7X/week  Follow Up  Recommendations Home health PT  PT equipment None recommended by PT  PT Goal Progression  Progress towards PT goals Progressing toward goals  PT General Charges  $$ ACUTE PT VISIT 1 Procedure  PT Treatments  $Gait Training 8-22 mins   Carmelia Bake, PT, DPT 01/11/2015 Pager: [phone]

## 2015-01-11 NOTE — Progress Notes (Signed)
     Subjective: 1 Day Post-Op Procedure(s) (LRB): LEFT TOTAL HIP ARTHROPLASTY ANTERIOR APPROACH  (Left)   Patient reports pain as mild, pain controlled. No events throughout the night.  She has been through this before and knows what she needs to do.  Ready to be discharged home.  Objective:   VITALS:   Filed Vitals:   01/11/15 0600  BP: 122/69  Pulse: 78  Temp: 98.7 F (37.1 C)  Resp: 16    Dorsiflexion/Plantar flexion intact Incision: dressing C/D/I No cellulitis present Compartment soft  LABS  Recent Labs  01/11/15 0410  HGB 10.4*  HCT 32.2*  WBC 6.4  PLT 240     Recent Labs  01/11/15 0410  NA 137  K 3.7  BUN 17  CREATININE 0.54  GLUCOSE 118*     Assessment/Plan: 1 Day Post-Op Procedure(s) (LRB): LEFT TOTAL HIP ARTHROPLASTY ANTERIOR APPROACH  (Left) Foley cath d/c'ed Advance diet Up with therapy D/C IV fluids Discharge home with home health  Follow up in 2 weeks at [facility]. Follow up with OLIN,Ellis Koffler D in 2 weeks.  Contact information:  [facility] [address], [place] [phone]    Overweight (BMI 25-29.9) Estimated body mass index is 27.62 kg/(m^2) as calculated from the following:   Height as of this encounter: 5\' 4"  (1.626 m).   Weight as of this encounter: 73.029 kg (161 lb). Patient also counseled that weight may inhibit the healing process Patient counseled that losing weight will help with future health issues        West Pugh. Ailanie Ruttan   PAC  01/11/2015, 9:38 AM

## 2015-01-11 NOTE — Care Management Note (Signed)
Case Management Note  Patient Details  Name: Amy Beck MRN: [id number] Date of Birth: June 09, 1961  Subjective/Objective:                   LEFT TOTAL HIP ARTHROPLASTY ANTERIOR APPROACH (Left) Action/Plan:  discharge planning Expected Discharge Date:  01/11/15               Expected Discharge Plan:  [name]  In-House Referral:     Discharge planning Services  CM Consult  Post Acute Care Choice:  Home Health Choice offered to:     DME Arranged:    DME Agency:     HH Arranged:  PT HH Agency:  [place]  Status of Service:  Completed, signed off  Medicare Important Message Given:    Date Medicare IM Given:    Medicare IM give by:    Date Additional Medicare IM Given:    Additional Medicare Important Message give by:     If discussed at [place] of Stay Meetings, dates discussed:    Additional Comments: CM met with pt in room to offer choice of home health agency.  Pt chooses [facility] to render HHPT.  Address and contact information verified by pt.  Referral given to [employer], Tim.  No DME needed as pt has rolling walker and 3n1 from previous surgery.  No other CM needs were communicated. Dellie Catholic, RN 01/11/2015, 10:35 AM

## 2015-01-11 NOTE — Progress Notes (Signed)
OT Cancellation Note  Patient Details Name: Amy Beck MRN: [id number] DOB: 1961/05/11   Cancelled Treatment:    Reason Eval/Treat Not Completed: OT screened, no needs identified, will sign off  [place], Thereasa Parkin 01/11/2015, 11:17 AM

## 2015-01-17 NOTE — Discharge Summary (Signed)
Physician Discharge Summary  Patient ID: Amy Beck MRN: [id number] DOB/AGE: [birth year] 54 y.o.  Admit date: 01/10/2015 Discharge date: 01/11/2015   Procedures:  Procedure(s) (LRB): LEFT TOTAL HIP ARTHROPLASTY ANTERIOR APPROACH  (Left)  Attending Physician:  Dr. Paralee Cancel   Admission Diagnoses:   Left hip primary OA / pain  Discharge Diagnoses:  Principal Problem:   S/P left THA, AA Active Problems:   Overweight (BMI 25.0-29.9)  Past Medical History  Diagnosis Date  . Allergy   . Hyperlipidemia   . Raynaud's disease   . Arthritis   . Chigger bites     HPI:    Amy Beck, 54 y.o. female, has a history of pain and functional disability in the left hip(s) due to arthritis and patient has failed non-surgical conservative treatments for greater than 12 weeks to include NSAID's and/or analgesics, use of assistive devices and activity modification. Onset of symptoms was gradual starting 2+ years ago with gradually worsening course since that time.The patient noted prior procedures of the hip to include arthroplasty on the right hip(s). Patient currently rates pain in the left hip at 9 out of 10 with activity. Patient has night pain, worsening of pain with activity and weight bearing, trendelenberg gait, pain that interfers with activities of daily living and pain with passive range of motion. Patient has evidence of periarticular osteophytes and joint space narrowing by imaging studies. This condition presents safety issues increasing the risk of falls. There is no current active infection. Risks, benefits and expectations were discussed with the patient. Risks including but not limited to the risk of anesthesia, blood clots, nerve damage, blood vessel damage, failure of the prosthesis, infection and up to and including death. Patient understand the risks, benefits and expectations and wishes to proceed with surgery.   PCP: Finis Bud, MD   Discharged Condition:  good  Hospital Course:  Patient underwent the above stated procedure on 01/10/2015. Patient tolerated the procedure well and brought to the recovery room in good condition and subsequently to the floor.  POD #1 BP: 122/69 ; Pulse: 78 ; Temp: 98.7 F (37.1 C) ; Resp: 16 Patient reports pain as mild, pain controlled. No events throughout the night. She has been through this before and knows what she needs to do. Ready to be discharged home. Dorsiflexion/plantar flexion intact, incision: dressing C/D/I, no cellulitis present and compartment soft.   LABS  Basename    HGB  10.4  HCT  32.2    Discharge Exam: General appearance: alert, cooperative and no distress Extremities: Homans sign is negative, no sign of DVT, no edema, redness or tenderness in the calves or thighs and no ulcers, gangrene or trophic changes  Disposition: Home with follow up in 2 weeks   Follow-up Information    Follow up with [name], MD. Schedule an appointment as soon as possible for a visit in 2 weeks.   Specialty:  Orthopedic Surgery   Contact information:   [address] [place] [address] [phone]       Follow up with [facility].   Why:  home health physical therapy   Contact information:   [place] [address] [phone]       Discharge Instructions    Call MD / Call 911    Complete by:  As directed   If you experience chest pain or shortness of breath, CALL 911 and be transported to the hospital emergency room.  If  you develope a fever above 101 F, pus (white drainage) or increased drainage or redness at the wound, or calf pain, call your surgeon's office.     Change dressing    Complete by:  As directed   Maintain surgical dressing until follow up in the clinic. If the edges start to pull up, may reinforce with tape. If the dressing is no longer working, may remove and cover with gauze and tape, but must keep the area dry and  clean.  Call with any questions or concerns.     Constipation Prevention    Complete by:  As directed   Drink plenty of fluids.  Prune juice may be helpful.  You may use a stool softener, such as Colace (over the counter) 100 mg twice a day.  Use MiraLax (over the counter) for constipation as needed.     Diet - low sodium heart healthy    Complete by:  As directed      Discharge instructions    Complete by:  As directed   Maintain surgical dressing until follow up in the clinic. If the edges start to pull up, may reinforce with tape. If the dressing is no longer working, may remove and cover with gauze and tape, but must keep the area dry and clean.  Follow up in 2 weeks at [facility]. Call with any questions or concerns.     Increase activity slowly as tolerated    Complete by:  As directed      TED hose    Complete by:  As directed   Use stockings (TED hose) for 2 weeks on both leg(s).  You may remove them at night for sleeping.     Weight bearing as tolerated    Complete by:  As directed   Laterality:  left  Extremity:  Lower             Medication List    STOP taking these medications        acetaminophen 500 MG tablet  Commonly known as:  TYLENOL     meloxicam 15 MG tablet  Commonly known as:  MOBIC      TAKE these medications        aspirin 325 MG EC tablet  Take 1 tablet (325 mg total) by mouth 2 (two) times daily.     CINNAMON PO  Take 1,000 mg by mouth daily.     docusate sodium 100 MG capsule  Commonly known as:  COLACE  Take 1 capsule (100 mg total) by mouth 2 (two) times daily.     EVENING PRIMROSE OIL PO  Take 1 tablet by mouth daily.     ferrous sulfate 325 (65 FE) MG tablet  Take 1 tablet (325 mg total) by mouth 3 (three) times daily after meals.     glucosamine-chondroitin 500-400 MG tablet  Take 2 tablets by mouth daily.     HAIR/SKIN/NAILS PO  Take 1 tablet by mouth daily.     HYDROcodone-acetaminophen 7.5-325 MG per tablet    Commonly known as:  NORCO  Take 1-2 tablets by mouth 3 (three) times daily as needed for moderate pain.     KRILL OIL PO  Take 1 capsule by mouth daily.     Linoleic Acid Conjugated 1000 MG Caps  Take 2 capsules by mouth daily.     magnesium oxide 400 MG tablet  Commonly known as:  MAG-OX  Take 400 mg by mouth daily.  methocarbamol 500 MG tablet  Commonly known as:  ROBAXIN  Take 1 tablet (500 mg total) by mouth every 6 (six) hours as needed for muscle spasms.     NON FORMULARY  Take 80 mg by mouth daily. Black cohash     oxymetazoline 0.05 % nasal spray  Commonly known as:  AFRIN  Place 1 spray into both nostrils 2 (two) times daily as needed for congestion.     polyethylene glycol packet  Commonly known as:  MIRALAX / GLYCOLAX  Take 17 g by mouth 2 (two) times daily.     PYCNOGENOL PO  Take 25 mg by mouth daily.     simvastatin 20 MG tablet  Commonly known as:  ZOCOR  Take 20 mg by mouth daily.     traMADol 50 MG tablet  Commonly known as:  ULTRAM  Take 1-2 tablets (50-100 mg total) by mouth every 6 (six) hours as needed for moderate pain or severe pain.         Signed: West Pugh. Anwitha Mapes   PA-C  01/17/2015, 9:56 AM

## 2015-05-01 NOTE — Patient Instructions (Signed)
Amy Beck  05/01/2015   Your procedure is scheduled on: 05/07/2016    Report to [facility] Main  Entrance take [place]  elevators to 3rd floor to  [place] at    0700 AM.  Call this number if you have problems the morning of surgery [phone]   Remember: ONLY 1 PERSON MAY GO WITH YOU TO SHORT STAY TO GET  READY MORNING OF [place].  Do not eat food or drink liquids :After Midnight.     Take these medicines the morning of surgery with A SIP OF WATER: Zyrtec if needed                                You may not have any metal on your body including hair pins and              piercings  Do not wear jewelry, make-up, lotions, powders or perfumes, deodorant             Do not wear nail polish.  Do not shave  48 hours prior to surgery.                 Do not bring valuables to the hospital. [place].  Contacts, dentures or bridgework may not be worn into surgery.  Leave suitcase in the car. After surgery it may be brought to your room.         Special Instructions: coughing and deep breathing exercises, leg exercises               Please read over the following fact sheets you were given: _____________________________________________________________________             [facility] - Preparing for Surgery Before surgery, you can play an important role.  Because skin is not sterile, your skin needs to be as free of germs as possible.  You can reduce the number of germs on your skin by washing with CHG (chlorahexidine gluconate) soap before surgery.  CHG is an antiseptic cleaner which kills germs and bonds with the skin to continue killing germs even after washing. Please DO NOT use if you have an allergy to CHG or antibacterial soaps.  If your skin becomes reddened/irritated stop using the CHG and inform your nurse when you arrive at Short Stay. Do not shave (including legs and underarms) for at  least 48 hours prior to the first CHG shower.  You may shave your face/neck. Please follow these instructions carefully:  1.  Shower with CHG Soap the night before surgery and the  morning of Surgery.  2.  If you choose to wash your hair, wash your hair first as usual with your  normal  shampoo.  3.  After you shampoo, rinse your hair and body thoroughly to remove the  shampoo.                           4.  Use CHG as you would any other liquid soap.  You can apply chg directly  to the skin and wash  Gently with a scrungie or clean washcloth.  5.  Apply the CHG Soap to your body ONLY FROM THE NECK DOWN.   Do not use on face/ open                           Wound or open sores. Avoid contact with eyes, ears mouth and genitals (private parts).                       Wash face,  Genitals (private parts) with your normal soap.             6.  Wash thoroughly, paying special attention to the area where your surgery  will be performed.  7.  Thoroughly rinse your body with warm water from the neck down.  8.  DO NOT shower/wash with your normal soap after using and rinsing off  the CHG Soap.                9.  Pat yourself dry with a clean towel.            10.  Wear clean pajamas.            11.  Place clean sheets on your bed the night of your first shower and do not  sleep with pets. Day of Surgery : Do not apply any lotions/deodorants the morning of surgery.  Please wear clean clothes to the hospital/surgery center.  FAILURE TO FOLLOW THESE INSTRUCTIONS MAY RESULT IN THE CANCELLATION OF YOUR SURGERY PATIENT SIGNATURE_________________________________  NURSE SIGNATURE__________________________________  ________________________________________________________________________  WHAT IS A BLOOD TRANSFUSION? Blood Transfusion Information  A transfusion is the replacement of blood or some of its parts. Blood is made up of multiple cells which provide different functions.  Red  blood cells carry oxygen and are used for blood loss replacement.  White blood cells fight against infection.  Platelets control bleeding.  Plasma helps clot blood.  Other blood products are available for specialized needs, such as hemophilia or other clotting disorders. BEFORE THE TRANSFUSION  Who gives blood for transfusions?   Healthy volunteers who are fully evaluated to make sure their blood is safe. This is blood bank blood. Transfusion therapy is the safest it has ever been in the practice of medicine. Before blood is taken from a donor, a complete history is taken to make sure that person has no history of diseases nor engages in risky social behavior (examples are intravenous drug use or sexual activity with multiple partners). The donor's travel history is screened to minimize risk of transmitting infections, such as malaria. The donated blood is tested for signs of infectious diseases, such as HIV and hepatitis. The blood is then tested to be sure it is compatible with you in order to minimize the chance of a transfusion reaction. If you or a relative donates blood, this is often done in anticipation of surgery and is not appropriate for emergency situations. It takes many days to process the donated blood. RISKS AND COMPLICATIONS Although transfusion therapy is very safe and saves many lives, the main dangers of transfusion include:  1. Getting an infectious disease. 2. Developing a transfusion reaction. This is an allergic reaction to something in the blood you were given. Every precaution is taken to prevent this. The decision to have a blood transfusion has been considered carefully by your caregiver before blood is given. Blood is not given unless the benefits outweigh  the risks. AFTER THE TRANSFUSION  Right after receiving a blood transfusion, you will usually feel much better and more energetic. This is especially true if your red blood cells have gotten low (anemic). The  transfusion raises the level of the red blood cells which carry oxygen, and this usually causes an energy increase.  The nurse administering the transfusion will monitor you carefully for complications. HOME CARE INSTRUCTIONS  No special instructions are needed after a transfusion. You may find your energy is better. Speak with your caregiver about any limitations on activity for underlying diseases you may have. SEEK MEDICAL CARE IF:   Your condition is not improving after your transfusion.  You develop redness or irritation at the intravenous (IV) site. SEEK IMMEDIATE MEDICAL CARE IF:  Any of the following symptoms occur over the next 12 hours:  Shaking chills.  You have a temperature by mouth above 102 F (38.9 C), not controlled by medicine.  Chest, back, or muscle pain.  People around you feel you are not acting correctly or are confused.  Shortness of breath or difficulty breathing.  Dizziness and fainting.  You get a rash or develop hives.  You have a decrease in urine output.  Your urine turns a dark color or changes to pink, red, or brown. Any of the following symptoms occur over the next 10 days:  You have a temperature by mouth above 102 F (38.9 C), not controlled by medicine.  Shortness of breath.  Weakness after normal activity.  The white part of the eye turns yellow (jaundice).  You have a decrease in the amount of urine or are urinating less often.  Your urine turns a dark color or changes to pink, red, or brown. Document Released: 07/05/2000 Document Revised: 09/30/2011 Document Reviewed: 02/22/2008 Exit[employer] Patient Information 2014 [place].  _______________________________________________________________________  Incentive Spirometer  An incentive spirometer is a tool that can help keep your lungs clear and active. This tool measures how well you are filling your lungs with each breath. Taking long deep breaths may help reverse or  decrease the chance of developing breathing (pulmonary) problems (especially infection) following:  A long period of time when you are unable to move or be active. BEFORE THE PROCEDURE   If the spirometer includes an indicator to show your best effort, your nurse or respiratory therapist will set it to a desired goal.  If possible, sit up straight or lean slightly forward. Try not to slouch.  Hold the incentive spirometer in an upright position. INSTRUCTIONS FOR USE  3. Sit on the edge of your bed if possible, or sit up as far as you can in bed or on a chair. 4. Hold the incentive spirometer in an upright position. 5. Breathe out normally. 6. Place the mouthpiece in your mouth and seal your lips tightly around it. 7. Breathe in slowly and as deeply as possible, raising the piston or the ball toward the top of the column. 8. Hold your breath for 3-5 seconds or for as long as possible. Allow the piston or ball to fall to the bottom of the column. 9. Remove the mouthpiece from your mouth and breathe out normally. 10. Rest for a few seconds and repeat Steps 1 through 7 at least 10 times every 1-2 hours when you are awake. Take your time and take a few normal breaths between deep breaths. 11. The spirometer may include an indicator to show your best effort. Use the indicator as a goal to work  toward during each repetition. 12. After each set of 10 deep breaths, practice coughing to be sure your lungs are clear. If you have an incision (the cut made at the time of surgery), support your incision when coughing by placing a pillow or rolled up towels firmly against it. Once you are able to get out of bed, walk around indoors and cough well. You may stop using the incentive spirometer when instructed by your caregiver.  RISKS AND COMPLICATIONS  Take your time so you do not get dizzy or light-headed.  If you are in pain, you may need to take or ask for pain medication before doing incentive  spirometry. It is harder to take a deep breath if you are having pain. AFTER USE  Rest and breathe slowly and easily.  It can be helpful to keep track of a log of your progress. Your caregiver can provide you with a simple table to help with this. If you are using the spirometer at home, follow these instructions: [place] IF:   You are having difficultly using the spirometer.  You have trouble using the spirometer as often as instructed.  Your pain medication is not giving enough relief while using the spirometer.  You develop fever of 100.5 F (38.1 C) or higher. SEEK IMMEDIATE MEDICAL CARE IF:   You cough up bloody sputum that had not been present before.  You develop fever of 102 F (38.9 C) or greater.  You develop worsening pain at or near the incision site. MAKE SURE YOU:   Understand these instructions.  Will watch your condition.  Will get help right away if you are not doing well or get worse. Document Released: 11/18/2006 Document Revised: 09/30/2011 Document Reviewed: 01/19/2007 [facility] Patient Information 2014 [place], [place].   ________________________________________________________________________

## 2015-05-01 NOTE — H&P (Signed)
TOTAL KNEE ADMISSION H&P  Patient is being admitted for bilateral total knee arthroplasties.  Subjective:  Chief Complaint:  Bilateral knee primary OA /  pain  HPI: Amy Beck, 54 y.o. female, has a history of pain and functional disability in the bilaterally knee due to arthritis and has failed non-surgical conservative treatments for greater than 12 weeks to include NSAID's and/or analgesics, use of assistive devices and activity modification.  Onset of symptoms was gradual, starting 5+ years ago with gradually worsening course since that time. The patient noted prior procedures on the knee to include  arthroscopy on the bilaterally knee(s).  Patient currently rates pain in the bilaterally knee(s) at 9 out of 10 with activity. Patient has night pain, worsening of pain with activity and weight bearing, pain that interferes with activities of daily living, pain with passive range of motion, crepitus and joint swelling.  Patient has evidence of periarticular osteophytes and joint space narrowing by imaging studies.  There is no active infection.  Risks, benefits and expectations were discussed with the patient.  Risks including but not limited to the risk of anesthesia, blood clots, nerve damage, blood vessel damage, failure of the prosthesis, infection and up to and including death.  Patient understand the risks, benefits and expectations and wishes to proceed with surgery.   PCP: Finis Bud, MD  D/C Plans:      Home with HHPT  Post-op Meds:       No Rx given  Tranexamic Acid:      To be given - IV   Decadron:      Is to be given  FYI:     Xarelto  then ASA post-op  Tramadol / APAP post-op   Patient Active Problem List   Diagnosis Date Noted  . 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 Overweight (BMI 25.0-29.9) 09/14/2014   Past Medical History  Diagnosis Date  . Allergy   . Hyperlipidemia   . Raynaud's disease   . Arthritis   . Chigger bites     Past Surgical History   Procedure Laterality Date  . Knee arthroscopy      both knees  . Dilation and curettage of uterus    . Colonoscopy    . Total hip arthroplasty Right 09/13/2014    Procedure: RIGHT TOTAL HIP ARTHROPLASTY ANTERIOR APPROACH;  Surgeon: [name], MD;  Location: [facility]S;  Service: Orthopedics;  Laterality: Right;  . Total hip arthroplasty Left 01/10/2015    Procedure: LEFT TOTAL HIP ARTHROPLASTY ANTERIOR APPROACH ;  Surgeon: Paralee Cancel, MD;  Location: [facility] ORS;  Service: Orthopedics;  Laterality: Left;    No prescriptions prior to admission   Allergies  Allergen Reactions  . Hydrocodone-Acetaminophen Nausea And Vomiting  . Magnesium Sulfate In D5w     OVERHEATED  . Wellbutrin [Bupropion Hcl] Hives    Social History  Substance Use Topics  . Smoking status: Never Smoker   . Smokeless tobacco: Never Used  . Alcohol Use: 3.0 - 4.2 oz/week    5-7 Glasses of wine per week    Family History  Problem Relation Age of Onset  . Esophageal cancer Father   . Pancreatic cancer Paternal Uncle   . Colon cancer Neg Hx   . Stomach cancer Neg Hx   . Rectal cancer Neg Hx      Review of Systems  Constitutional: Negative.   HENT: Negative.   Eyes: Negative.   Respiratory: Negative.   Cardiovascular: Negative.   Gastrointestinal:  Negative.   Genitourinary: Negative.   Musculoskeletal: Positive for joint pain.  Skin: Negative.   Neurological: Negative.   Endo/Heme/Allergies: Positive for environmental allergies.  Psychiatric/Behavioral: Negative.     Objective:  Physical Exam  Constitutional: She is oriented to person, place, and time. She appears well-developed and well-nourished.  HENT:  Head: Normocephalic.  Eyes: Pupils are equal, round, and reactive to light.  Neck: Neck supple. No JVD present. No tracheal deviation present. No thyromegaly present.  Cardiovascular: Normal rate, regular rhythm, normal heart sounds and intact distal pulses.   Respiratory: Effort normal and breath  sounds normal. No stridor. No respiratory distress. She has no wheezes.  GI: Soft. There is no tenderness. There is no guarding.  Musculoskeletal:       Right knee: She exhibits decreased range of motion, swelling, abnormal alignment and bony tenderness. She exhibits no ecchymosis, no deformity, no laceration and no erythema. Tenderness found.       Left knee: She exhibits decreased range of motion, swelling, abnormal alignment and bony tenderness. She exhibits no ecchymosis, no deformity, no laceration and no erythema. Tenderness found.  Lymphadenopathy:    She has no cervical adenopathy.  Neurological: She is alert and oriented to person, place, and time.  Skin: Skin is warm and dry.  Psychiatric: She has a normal mood and affect.      Labs:  Estimated body mass index is 27.62 kg/(m^2) as calculated from the following:   Height as of 01/10/15: 5\' 4"  (1.626 m).   Weight as of 01/05/15: 73.029 kg (161 lb).   Imaging Review Plain radiographs demonstrate severe degenerative joint disease of the bilaterally knee(s). The overall alignment is valgus. The bone quality appears to be good for age and reported activity level.  Assessment/Plan:  End stage arthritis, bilaterally knee   The patient history, physical examination, clinical judgment of the provider and imaging studies are consistent with end stage degenerative joint disease of the bilaterally knee(s) and total knee arthroplasty is deemed medically necessary. The treatment options including medical management, injection therapy arthroscopy and arthroplasty were discussed at length. The risks and benefits of total knee arthroplasty were presented and reviewed. The risks due to aseptic loosening, infection, stiffness, patella tracking problems, thromboembolic complications and other imponderables were discussed. The patient acknowledged the explanation, agreed to proceed with the plan and consent was signed. Patient is being admitted for  inpatient treatment for surgery, pain control, PT, OT, prophylactic antibiotics, VTE prophylaxis, progressive ambulation and ADL's and discharge planning. The patient is planning to be discharged home with home health services.      [place] Izaiyah Kleinman   PA-C  05/01/2015, 2:33 PM

## 2015-05-03 ENCOUNTER — Encounter (HOSPITAL_COMMUNITY)
Admission: RE | Admit: 2015-05-03 | Discharge: 2015-05-03 | Disposition: A | Discharge status: Home / Self Care | Payer: BLUE CROSS/BLUE SHIELD | Source: Ambulatory Visit | Attending: Orthopedic Surgery | Admitting: Orthopedic Surgery

## 2015-05-03 ENCOUNTER — Encounter (HOSPITAL_COMMUNITY): Payer: Self-pay

## 2015-05-03 DIAGNOSIS — M179 Osteoarthritis of knee, unspecified: Secondary | ICD-10-CM | POA: Insufficient documentation

## 2015-05-03 DIAGNOSIS — Z01818 Encounter for other preprocedural examination: Secondary | ICD-10-CM | POA: Diagnosis not present

## 2015-05-03 LAB — BASIC METABOLIC PANEL
Anion gap: 9 (ref 5–15)
BUN: 17 mg/dL (ref 6–20)
CHLORIDE: 103 mmol/L (ref 101–111)
CO2: 25 mmol/L (ref 22–32)
CREATININE: 0.57 mg/dL (ref 0.44–1.00)
Calcium: 9.6 mg/dL (ref 8.9–10.3)
GFR calc Af Amer: >60 mL/min (ref >60)
Glucose, Bld: 99 mg/dL (ref 65–99)
Potassium: 4.6 mmol/L (ref 3.5–5.1)
Sodium: 137 mmol/L (ref 135–145)

## 2015-05-03 LAB — CBC
HCT: 43.8 % (ref 36.0–46.0)
Hemoglobin: 14.7 g/dL (ref 12.0–15.0)
MCH: 31.4 pg (ref 26.0–34.0)
MCHC: 33.6 g/dL (ref 30.0–36.0)
MCV: 93.6 fL (ref 78.0–100.0)
PLATELETS: 281 10*3/uL (ref 150–400)
RBC: 4.68 MIL/uL (ref 3.87–5.11)
RDW: 13.9 % (ref 11.5–15.5)
WBC: 4.3 10*3/uL (ref 4.0–10.5)

## 2015-05-03 LAB — URINALYSIS, ROUTINE W REFLEX MICROSCOPIC
GLUCOSE, UA: NEGATIVE mg/dL
HGB URINE DIPSTICK: NEGATIVE
KETONES UR: NEGATIVE mg/dL
Leukocytes, UA: NEGATIVE
Nitrite: NEGATIVE
PROTEIN: NEGATIVE mg/dL
Specific Gravity, Urine: 1.027 (ref 1.005–1.030)
Urobilinogen, UA: 1 mg/dL (ref 0.0–1.0)
pH: 7 (ref 5.0–8.0)

## 2015-05-03 LAB — APTT: APTT: 31 s (ref 24–37)

## 2015-05-03 LAB — PROTIME-INR
INR: 0.9 (ref 0.00–1.49)
Prothrombin Time: 12.4 seconds (ref 11.6–15.2)

## 2015-05-03 LAB — SURGICAL PCR SCREEN
MRSA, PCR: NEGATIVE
Staphylococcus aureus: NEGATIVE

## 2015-05-04 NOTE — Progress Notes (Signed)
Clearance on chart from Dr Marveen Reeks dated 04/20/15.

## 2015-05-08 ENCOUNTER — Inpatient Hospital Stay (HOSPITAL_COMMUNITY): Payer: BLUE CROSS/BLUE SHIELD | Admitting: Anesthesiology

## 2015-05-08 ENCOUNTER — Inpatient Hospital Stay (HOSPITAL_COMMUNITY)
Admission: RE | Admit: 2015-05-08 | Discharge: 2015-05-10 | DRG: 462 | Disposition: A | Discharge status: Home Health | Payer: BLUE CROSS/BLUE SHIELD | Source: Ambulatory Visit | Attending: Orthopedic Surgery | Admitting: Orthopedic Surgery

## 2015-05-08 ENCOUNTER — Encounter (HOSPITAL_COMMUNITY): Payer: Self-pay | Admitting: *Deleted

## 2015-05-08 ENCOUNTER — Encounter (HOSPITAL_COMMUNITY)
Admission: RE | Disposition: A | Discharge status: Home Health | Payer: Self-pay | Source: Ambulatory Visit | Attending: Orthopedic Surgery

## 2015-05-08 DIAGNOSIS — M659 Synovitis and tenosynovitis, unspecified: Secondary | ICD-10-CM | POA: Diagnosis present

## 2015-05-08 DIAGNOSIS — E663 Overweight: Secondary | ICD-10-CM | POA: Diagnosis present

## 2015-05-08 DIAGNOSIS — Z6827 Body mass index (BMI) 27.0-27.9, adult: Secondary | ICD-10-CM

## 2015-05-08 DIAGNOSIS — M170 Bilateral primary osteoarthritis of knee: Principal | ICD-10-CM | POA: Diagnosis present

## 2015-05-08 DIAGNOSIS — Z96643 Presence of artificial hip joint, bilateral: Secondary | ICD-10-CM | POA: Diagnosis present

## 2015-05-08 DIAGNOSIS — Z96653 Presence of artificial knee joint, bilateral: Secondary | ICD-10-CM

## 2015-05-08 DIAGNOSIS — Z01812 Encounter for preprocedural laboratory examination: Secondary | ICD-10-CM

## 2015-05-08 DIAGNOSIS — M25569 Pain in unspecified knee: Secondary | ICD-10-CM | POA: Diagnosis present

## 2015-05-08 HISTORY — PX: TOTAL KNEE ARTHROPLASTY: SHX125

## 2015-05-08 LAB — TYPE AND SCREEN
ABO/RH(D): O POS
ANTIBODY SCREEN: NEGATIVE

## 2015-05-08 SURGERY — ARTHROPLASTY, KNEE, BILATERAL, TOTAL
Anesthesia: Spinal | Site: Knee | Laterality: Bilateral

## 2015-05-08 MED ORDER — BUPIVACAINE-EPINEPHRINE (PF) 0.25% -1:200000 IJ SOLN
INTRAMUSCULAR | Status: AC
  Filled 2015-05-08: qty 30

## 2015-05-08 MED ORDER — PROPOFOL 10 MG/ML IV BOLUS
INTRAVENOUS | Status: AC
  Filled 2015-05-08: qty 20

## 2015-05-08 MED ORDER — ACETAMINOPHEN 650 MG RE SUPP: 650.0000 mg | Freq: Four times a day (QID) | RECTAL | Status: DC | PRN

## 2015-05-08 MED ORDER — POLYETHYLENE GLYCOL 3350 17 G PO PACK
17.0000 g | PACK | Freq: Two times a day (BID) | ORAL | Status: DC
  Administered 2015-05-08 – 2015-05-10 (×4): 17 g via ORAL

## 2015-05-08 MED ORDER — KETOROLAC TROMETHAMINE 30 MG/ML IJ SOLN
INTRAMUSCULAR | Status: DC | PRN
  Administered 2015-05-08 (×2): 15 mg

## 2015-05-08 MED ORDER — MIDAZOLAM HCL 2 MG/2ML IJ SOLN
INTRAMUSCULAR | Status: AC
  Filled 2015-05-08: qty 4

## 2015-05-08 MED ORDER — METHOCARBAMOL 1000 MG/10ML IJ SOLN
500.0000 mg | Freq: Four times a day (QID) | INTRAVENOUS | Status: DC | PRN
  Administered 2015-05-08 (×2): 500 mg via INTRAVENOUS
  Filled 2015-05-08 (×4): qty 5

## 2015-05-08 MED ORDER — ONDANSETRON HCL 4 MG/2ML IJ SOLN
INTRAMUSCULAR | Status: DC | PRN
  Administered 2015-05-08: 4 mg via INTRAVENOUS

## 2015-05-08 MED ORDER — PHENYLEPHRINE 40 MCG/ML (10ML) SYRINGE FOR IV PUSH (FOR BLOOD PRESSURE SUPPORT)
PREFILLED_SYRINGE | INTRAVENOUS | Status: AC
  Filled 2015-05-08: qty 10

## 2015-05-08 MED ORDER — METOCLOPRAMIDE HCL 10 MG PO TABS: 5.0000 mg | ORAL_TABLET | Freq: Three times a day (TID) | ORAL | Status: DC | PRN

## 2015-05-08 MED ORDER — DEXAMETHASONE SODIUM PHOSPHATE 10 MG/ML IJ SOLN
INTRAMUSCULAR | Status: AC
Start: 2015-05-08 — End: 2015-05-08
  Filled 2015-05-08: qty 1

## 2015-05-08 MED ORDER — MAGNESIUM CITRATE PO SOLN
1.0000 | Freq: Once | ORAL | Status: DC | PRN
Start: 2015-05-08 — End: 2015-05-10

## 2015-05-08 MED ORDER — LACTATED RINGERS IV SOLN: INTRAVENOUS | Status: DC

## 2015-05-08 MED ORDER — PROPOFOL 10 MG/ML IV BOLUS
INTRAVENOUS | Status: DC | PRN
  Administered 2015-05-08: 40 mg via INTRAVENOUS

## 2015-05-08 MED ORDER — CELECOXIB 200 MG PO CAPS
200.0000 mg | ORAL_CAPSULE | Freq: Two times a day (BID) | ORAL | Status: DC
  Administered 2015-05-08 – 2015-05-10 (×4): 200 mg via ORAL
  Filled 2015-05-08 (×5): qty 1

## 2015-05-08 MED ORDER — CEFAZOLIN SODIUM-DEXTROSE 2-3 GM-% IV SOLR
INTRAVENOUS | Status: AC
  Filled 2015-05-08: qty 50

## 2015-05-08 MED ORDER — DEXAMETHASONE SODIUM PHOSPHATE 10 MG/ML IJ SOLN
10.0000 mg | Freq: Once | INTRAMUSCULAR | Status: AC
  Administered 2015-05-09: 10 mg via INTRAVENOUS
  Filled 2015-05-08: qty 1

## 2015-05-08 MED ORDER — BUPIVACAINE-EPINEPHRINE (PF) 0.25% -1:200000 IJ SOLN
INTRAMUSCULAR | Status: DC | PRN
  Administered 2015-05-08 (×2): 15 mL

## 2015-05-08 MED ORDER — ALUM & MAG HYDROXIDE-SIMETH 200-200-20 MG/5ML PO SUSP
30.0000 mL | ORAL | Status: DC | PRN
  Administered 2015-05-08: 30 mL via ORAL
  Filled 2015-05-08: qty 30

## 2015-05-08 MED ORDER — LORATADINE 10 MG PO TABS
10.0000 mg | ORAL_TABLET | Freq: Every day | ORAL | Status: DC
  Filled 2015-05-08 (×4): qty 1

## 2015-05-08 MED ORDER — FENTANYL CITRATE (PF) 100 MCG/2ML IJ SOLN
INTRAMUSCULAR | Status: DC | PRN
  Administered 2015-05-08: 100 ug via INTRAVENOUS
  Administered 2015-05-08 (×2): 25 ug via INTRAVENOUS
  Administered 2015-05-08: 50 ug via INTRAVENOUS

## 2015-05-08 MED ORDER — HYDROMORPHONE HCL 1 MG/ML IJ SOLN
INTRAMUSCULAR | Status: AC
  Filled 2015-05-08: qty 1

## 2015-05-08 MED ORDER — HYDROMORPHONE HCL 1 MG/ML IJ SOLN
0.5000 mg | INTRAMUSCULAR | Status: DC | PRN
  Administered 2015-05-08 – 2015-05-09 (×5): 1 mg via INTRAVENOUS
  Filled 2015-05-08 (×6): qty 1

## 2015-05-08 MED ORDER — ACETAMINOPHEN 325 MG PO TABS
650.0000 mg | ORAL_TABLET | Freq: Four times a day (QID) | ORAL | Status: DC | PRN
  Administered 2015-05-08 – 2015-05-10 (×5): 650 mg via ORAL
  Filled 2015-05-08 (×5): qty 2

## 2015-05-08 MED ORDER — ONDANSETRON HCL 4 MG PO TABS: 4.0000 mg | ORAL_TABLET | Freq: Four times a day (QID) | ORAL | Status: DC | PRN

## 2015-05-08 MED ORDER — MENTHOL 3 MG MT LOZG: 1.0000 | LOZENGE | OROMUCOSAL | Status: DC | PRN

## 2015-05-08 MED ORDER — FENTANYL CITRATE (PF) 100 MCG/2ML IJ SOLN
INTRAMUSCULAR | Status: AC
  Filled 2015-05-08: qty 4

## 2015-05-08 MED ORDER — HYDROMORPHONE HCL 1 MG/ML IJ SOLN
0.2500 mg | INTRAMUSCULAR | Status: DC | PRN
  Administered 2015-05-08 (×3): 0.5 mg via INTRAVENOUS

## 2015-05-08 MED ORDER — BUPIVACAINE HCL (PF) 0.5 % IJ SOLN
INTRAMUSCULAR | Status: DC | PRN
Start: 2015-05-08 — End: 2015-05-08
  Administered 2015-05-08: 15 mg

## 2015-05-08 MED ORDER — LACTATED RINGERS IV SOLN
INTRAVENOUS | Status: DC | PRN
  Administered 2015-05-08 (×3): via INTRAVENOUS

## 2015-05-08 MED ORDER — TRANEXAMIC ACID 1000 MG/10ML IV SOLN
1000.0000 mg | Freq: Once | INTRAVENOUS | Status: AC
  Administered 2015-05-08: 1000 mg via INTRAVENOUS
  Filled 2015-05-08: qty 10

## 2015-05-08 MED ORDER — PHENOL 1.4 % MT LIQD
1.0000 | OROMUCOSAL | Status: DC | PRN
Start: 2015-05-08 — End: 2015-05-10

## 2015-05-08 MED ORDER — PROPOFOL 500 MG/50ML IV EMUL
INTRAVENOUS | Status: DC | PRN
  Administered 2015-05-08: 100 ug/kg/min via INTRAVENOUS

## 2015-05-08 MED ORDER — PHENYLEPHRINE HCL 10 MG/ML IJ SOLN
INTRAMUSCULAR | Status: DC | PRN
  Administered 2015-05-08: 80 ug via INTRAVENOUS

## 2015-05-08 MED ORDER — ONDANSETRON HCL 4 MG/2ML IJ SOLN
INTRAMUSCULAR | Status: AC
  Filled 2015-05-08: qty 2

## 2015-05-08 MED ORDER — CEFAZOLIN SODIUM-DEXTROSE 2-3 GM-% IV SOLR
2.0000 g | INTRAVENOUS | Status: AC
  Administered 2015-05-08: 2 g via INTRAVENOUS

## 2015-05-08 MED ORDER — HYDROMORPHONE HCL 1 MG/ML IJ SOLN
0.2500 mg | INTRAMUSCULAR | Status: DC | PRN
  Administered 2015-05-08 (×4): 0.5 mg via INTRAVENOUS

## 2015-05-08 MED ORDER — OXYMETAZOLINE HCL 0.05 % NA SOLN
1.0000 | Freq: Two times a day (BID) | NASAL | Status: DC | PRN
  Filled 2015-05-08: qty 15

## 2015-05-08 MED ORDER — DEXAMETHASONE SODIUM PHOSPHATE 10 MG/ML IJ SOLN
10.0000 mg | Freq: Once | INTRAMUSCULAR | Status: AC
  Administered 2015-05-08: 10 mg via INTRAVENOUS

## 2015-05-08 MED ORDER — FERROUS SULFATE 325 (65 FE) MG PO TABS
325.0000 mg | ORAL_TABLET | Freq: Three times a day (TID) | ORAL | Status: DC
  Administered 2015-05-09 – 2015-05-10 (×4): 325 mg via ORAL
  Filled 2015-05-08 (×9): qty 1

## 2015-05-08 MED ORDER — SODIUM CHLORIDE 0.9 % IJ SOLN
INTRAMUSCULAR | Status: AC
  Filled 2015-05-08: qty 50

## 2015-05-08 MED ORDER — DIPHENHYDRAMINE HCL 25 MG PO CAPS: 25.0000 mg | ORAL_CAPSULE | Freq: Four times a day (QID) | ORAL | Status: DC | PRN

## 2015-05-08 MED ORDER — HYDROMORPHONE HCL 2 MG PO TABS
2.0000 mg | ORAL_TABLET | ORAL | Status: DC
  Administered 2015-05-08: 4 mg via ORAL
  Administered 2015-05-08: 2 mg via ORAL
  Administered 2015-05-09: 4 mg via ORAL
  Filled 2015-05-08 (×2): qty 2
  Filled 2015-05-08: qty 1
  Filled 2015-05-08: qty 2

## 2015-05-08 MED ORDER — SIMVASTATIN 20 MG PO TABS
20.0000 mg | ORAL_TABLET | Freq: Every day | ORAL | Status: DC
  Administered 2015-05-08 – 2015-05-09 (×2): 20 mg via ORAL
  Filled 2015-05-08 (×4): qty 1

## 2015-05-08 MED ORDER — KETOROLAC TROMETHAMINE 30 MG/ML IJ SOLN
INTRAMUSCULAR | Status: AC
  Filled 2015-05-08: qty 1

## 2015-05-08 MED ORDER — METHOCARBAMOL 500 MG PO TABS
500.0000 mg | ORAL_TABLET | Freq: Four times a day (QID) | ORAL | Status: DC | PRN
  Administered 2015-05-09 – 2015-05-10 (×4): 500 mg via ORAL
  Filled 2015-05-08 (×4): qty 1

## 2015-05-08 MED ORDER — PROPOFOL 10 MG/ML IV BOLUS
INTRAVENOUS | Status: AC
Start: 2015-05-08 — End: 2015-05-08
  Filled 2015-05-08: qty 20

## 2015-05-08 MED ORDER — DOCUSATE SODIUM 100 MG PO CAPS
100.0000 mg | ORAL_CAPSULE | Freq: Two times a day (BID) | ORAL | Status: DC
  Administered 2015-05-08 – 2015-05-10 (×4): 100 mg via ORAL

## 2015-05-08 MED ORDER — BISACODYL 10 MG RE SUPP: 10.0000 mg | Freq: Every day | RECTAL | Status: DC | PRN

## 2015-05-08 MED ORDER — SODIUM CHLORIDE 0.9 % IV SOLN
INTRAVENOUS | Status: DC
  Administered 2015-05-08 – 2015-05-09 (×3): via INTRAVENOUS
  Filled 2015-05-08 (×7): qty 1000

## 2015-05-08 MED ORDER — METOCLOPRAMIDE HCL 5 MG/ML IJ SOLN: 5.0000 mg | Freq: Three times a day (TID) | INTRAMUSCULAR | Status: DC | PRN

## 2015-05-08 MED ORDER — TRAMADOL HCL 50 MG PO TABS: 50.0000 mg | ORAL_TABLET | Freq: Four times a day (QID) | ORAL | Status: DC

## 2015-05-08 MED ORDER — SODIUM CHLORIDE 0.9 % IJ SOLN
INTRAMUSCULAR | Status: DC | PRN
  Administered 2015-05-08 (×2): 15 mL

## 2015-05-08 MED ORDER — 0.9 % SODIUM CHLORIDE (POUR BTL) OPTIME
TOPICAL | Status: DC | PRN
  Administered 2015-05-08: 1000 mL

## 2015-05-08 MED ORDER — MIDAZOLAM HCL 5 MG/5ML IJ SOLN
INTRAMUSCULAR | Status: DC | PRN
Start: 2015-05-08 — End: 2015-05-08
  Administered 2015-05-08 (×2): 2 mg via INTRAVENOUS

## 2015-05-08 MED ORDER — RIVAROXABAN 10 MG PO TABS
10.0000 mg | ORAL_TABLET | ORAL | Status: DC
  Administered 2015-05-09 – 2015-05-10 (×2): 10 mg via ORAL
  Filled 2015-05-08 (×3): qty 1

## 2015-05-08 MED ORDER — CEFAZOLIN SODIUM-DEXTROSE 2-3 GM-% IV SOLR
2.0000 g | Freq: Four times a day (QID) | INTRAVENOUS | Status: AC
  Administered 2015-05-08 (×2): 2 g via INTRAVENOUS
  Filled 2015-05-08 (×2): qty 50

## 2015-05-08 MED ORDER — SODIUM CHLORIDE 0.9 % IR SOLN
Status: DC | PRN
  Administered 2015-05-08: 3000 mL

## 2015-05-08 MED ORDER — CHLORHEXIDINE GLUCONATE 4 % EX LIQD: 60.0000 mL | Freq: Once | CUTANEOUS | Status: DC

## 2015-05-08 MED ORDER — ONDANSETRON HCL 4 MG/2ML IJ SOLN: 4.0000 mg | Freq: Four times a day (QID) | INTRAMUSCULAR | Status: DC | PRN

## 2015-05-08 SURGICAL SUPPLY — 62 items
BAG ZIPLOCK 12X15 (MISCELLANEOUS) ×3 IMPLANT
BANDAGE ELASTIC 6 VELCRO ST LF (GAUZE/BANDAGES/DRESSINGS) ×6 IMPLANT
BANDAGE ESMARK 6X9 LF (GAUZE/BANDAGES/DRESSINGS) ×2 IMPLANT
BLADE SAW SGTL 13.0X1.19X90.0M (BLADE) ×6 IMPLANT
BLADE SURG SZ10 CARB STEEL (BLADE) ×3 IMPLANT
BNDG COHESIVE 4X5 TAN STRL (GAUZE/BANDAGES/DRESSINGS) ×3 IMPLANT
BNDG ESMARK 6X9 LF (GAUZE/BANDAGES/DRESSINGS) ×6
BOWL SMART MIX CTS (DISPOSABLE) ×6 IMPLANT
CAPT KNEE TOTAL 3 ATTUNE ×6 IMPLANT
CEMENT HV SMART SET (Cement) ×12 IMPLANT
CUFF TOURN SGL QUICK 34 (TOURNIQUET CUFF) ×4
CUFF TRNQT CYL 34X4X40X1 (TOURNIQUET CUFF) ×2 IMPLANT
DECANTER SPIKE VIAL GLASS SM (MISCELLANEOUS) ×6 IMPLANT
DRAPE EXTREMITY BILATERAL (DRAPE) ×3 IMPLANT
DRAPE INCISE IOBAN 66X45 STRL (DRAPES) ×3 IMPLANT
DRAPE POUCH INSTRU U-SHP 10X18 (DRAPES) ×3 IMPLANT
DRAPE SHEET LG 3/4 BI-LAMINATE (DRAPES) ×3 IMPLANT
DRAPE U-SHAPE 47X51 STRL (DRAPES) ×9 IMPLANT
DRSG AQUACEL AG ADV 3.5X10 (GAUZE/BANDAGES/DRESSINGS) ×6 IMPLANT
DURAPREP 26ML APPLICATOR (WOUND CARE) ×12 IMPLANT
ELECT REM PT RETURN 9FT ADLT (ELECTROSURGICAL) ×3
ELECTRODE REM PT RTRN 9FT ADLT (ELECTROSURGICAL) ×1 IMPLANT
FACESHIELD WRAPAROUND (MASK) ×27 IMPLANT
GLOVE BIO SURGEON STRL SZ7.5 (GLOVE) ×3 IMPLANT
GLOVE BIOGEL PI IND STRL 7.0 (GLOVE) ×2 IMPLANT
GLOVE BIOGEL PI IND STRL 7.5 (GLOVE) ×3 IMPLANT
GLOVE BIOGEL PI IND STRL 8.5 (GLOVE) ×1 IMPLANT
GLOVE BIOGEL PI INDICATOR 7.0 (GLOVE) ×4
GLOVE BIOGEL PI INDICATOR 7.5 (GLOVE) ×6
GLOVE BIOGEL PI INDICATOR 8.5 (GLOVE) ×2
GLOVE ECLIPSE 8.0 STRL XLNG CF (GLOVE) ×12 IMPLANT
GLOVE ORTHO TXT STRL SZ7.5 (GLOVE) ×12 IMPLANT
GLOVE SURG SS PI 7.0 STRL IVOR (GLOVE) ×3 IMPLANT
GOWN SPEC L3 XXLG W/TWL (GOWN DISPOSABLE) ×3 IMPLANT
GOWN STRL REUS W/TWL LRG LVL3 (GOWN DISPOSABLE) ×6 IMPLANT
GOWN STRL REUS W/TWL XL LVL3 (GOWN DISPOSABLE) ×3 IMPLANT
HANDPIECE INTERPULSE COAX TIP (DISPOSABLE) ×2
KIT BASIN OR (CUSTOM PROCEDURE TRAY) ×3 IMPLANT
LIQUID BAND (GAUZE/BANDAGES/DRESSINGS) ×6 IMPLANT
MANIFOLD NEPTUNE II (INSTRUMENTS) ×3 IMPLANT
NDL SAFETY ECLIPSE 18X1.5 (NEEDLE) ×2 IMPLANT
NEEDLE HYPO 18GX1.5 SHARP (NEEDLE) ×4
NS IRRIG 1000ML POUR BTL (IV SOLUTION) ×3 IMPLANT
PACK TOTAL JOINT (CUSTOM PROCEDURE TRAY) ×3 IMPLANT
SET HNDPC FAN SPRY TIP SCT (DISPOSABLE) ×1 IMPLANT
SET PAD KNEE POSITIONER (MISCELLANEOUS) ×6 IMPLANT
SPONGE LAP 18X18 X RAY DECT (DISPOSABLE) ×6 IMPLANT
STOCKINETTE 8 INCH (MISCELLANEOUS) ×3 IMPLANT
SUCTION FRAZIER 12FR DISP (SUCTIONS) ×3 IMPLANT
SUT MNCRL AB 4-0 PS2 18 (SUTURE) ×6 IMPLANT
SUT VIC AB 1 CT1 27 (SUTURE) ×6
SUT VIC AB 1 CT1 27XBRD ANTBC (SUTURE) ×3 IMPLANT
SUT VIC AB 2-0 CT1 27 (SUTURE) ×8
SUT VIC AB 2-0 CT1 TAPERPNT 27 (SUTURE) ×4 IMPLANT
SUT VLOC 180 0 24IN GS25 (SUTURE) ×6 IMPLANT
SYR 30ML LL (SYRINGE) ×6 IMPLANT
TOWEL OR 17X26 10 PK STRL BLUE (TOWEL DISPOSABLE) ×3 IMPLANT
TOWEL OR NON WOVEN STRL DISP B (DISPOSABLE) ×3 IMPLANT
TRAY FOLEY W/METER SILVER 14FR (SET/KITS/TRAYS/PACK) ×3 IMPLANT
TRAY FOLEY W/METER SILVER 16FR (SET/KITS/TRAYS/PACK) IMPLANT
WATER STERILE IRR 1500ML POUR (IV SOLUTION) ×3 IMPLANT
WRAP KNEE MAXI GEL POST OP (GAUZE/BANDAGES/DRESSINGS) ×6 IMPLANT

## 2015-05-08 NOTE — Anesthesia Postprocedure Evaluation (Signed)
  Anesthesia Post-op Note  Patient: Amy Beck  Procedure(s) Performed: Procedure(s) (LRB): TOTAL KNEE BILATERAL (Bilateral)  Patient Location: PACU  Anesthesia Type: Spinal  Level of Consciousness: awake and alert   Airway and Oxygen Therapy: Patient Spontanous Breathing  Post-op Pain: mild  Post-op Assessment: Post-op Vital signs reviewed, Patient's Cardiovascular Status Stable, Respiratory Function Stable, Patent Airway and No signs of Nausea or vomiting  Last Vitals:  Filed Vitals:   05/08/15 1452  BP: 148/87  Pulse: 60  Temp: 36.3 C  Resp: 14    Post-op Vital Signs: stable   Complications: No apparent anesthesia complications

## 2015-05-08 NOTE — Op Note (Signed)
NAME:  Amy Beck                      MEDICAL RECORD NO.:  [id number]                             FACILITY:  [facility]      PHYSICIAN:  Pietro Cassis. Alvan Dame, M.D.  DATE OF BIRTH:  1961-03-23      DATE OF PROCEDURE:  05/08/2015                                     OPERATIVE REPORT         PREOPERATIVE DIAGNOSIS:  Left knee osteoarthritis.      POSTOPERATIVE DIAGNOSIS:  Left knee osteoarthritis.      FINDINGS:  The patient was noted to have complete loss of cartilage and   bone-on-bone arthritis with associated osteophytes in all three compartments of   the knee with a significant synovitis and associated effusion.      PROCEDURE:  Left total knee replacement.      COMPONENTS USED:  DePuy Attune rotating platform posterior stabilized knee   system, a size 4N femur, 4 tibia, size 7 mm PS AOX insert, and 35 patellar   button.      SURGEON:  Pietro Cassis. Alvan Dame, M.D.      ASSISTANT:  Danae Orleans, PA-C.      ANESTHESIA:  Spinal.      SPECIMENS:  None.      COMPLICATION:  None.      DRAINS:  One Hemovac.  EBL: <50cc      TOURNIQUET TIME:   Total Tourniquet Time Documented: Thigh (Left) - 30 minutes Total: Thigh (Left) - 30 minutes  Thigh (Right) - 45 minutes Total: Thigh (Right) - 45 minutes     The patient was stable to the recovery room.      INDICATION FOR PROCEDURE:  Amy Beck is a 54 y.o. female patient of   mine.  The patient had been seen, evaluated, and treated conservatively in the   office with medication, activity modification, and injections.  The patient had   radiographic changes of bone-on-bone arthritis with endplate sclerosis and osteophytes noted.      The patient failed conservative measures including medication, injections, and activity modification, and at this point was ready for more definitive measures.   Based on the radiographic changes and failed conservative measures, the patient   decided to proceed with total knee replacement.  Risks of  infection,   DVT, component failure, need for revision surgery, postop course, and   expectations were all   discussed and reviewed.  Consent was obtained for benefit of pain   relief.      PROCEDURE IN DETAIL:  The patient was brought to the operative theater.   Once adequate anesthesia, preoperative antibiotics, 2 gm of Ancef, 1 gm of Tranexamic Acid, and 10 mg of Decadron administered, the patient was positioned supine with the bilateral thigh tourniquets placed.  The  left lower extremity was prepped and draped in sterile fashion.  A time-   out was performed identifying the patient, planned procedure, and   extremity.      The left lower extremity was placed in the [facility] leg holder.  The leg was   exsanguinated, tourniquet elevated to 250 mmHg.  A midline  incision was   made followed by median parapatellar arthrotomy.  Following initial   exposure, attention was first directed to the patella.  Precut   measurement was noted to be 21 mm.  I resected down to 14 mm and used a   35 patellar button to restore patellar height as well as cover the cut   surface.      The lug holes were drilled and a metal shim was placed to protect the   patella from retractors and saw blades.      At this point, attention was now directed to the femur.  The femoral   canal was opened with a drill, irrigated to try to prevent fat emboli.  An   intramedullary rod was passed at 3 degrees valgus, 9 mm of bone was   resected off the distal femur.  Following this resection, the tibia was   subluxated anteriorly.  Using the extramedullary guide, 2 mm of bone was resected off   the proximal lateral tibia.  We confirmed the gap would be   stable medially and laterally with a size 5 mm insert as well as confirmed   the cut was perpendicular in the coronal plane, checking with an alignment rod.      Once this was done, I sized the femur to be a size 4 in the anterior-   posterior dimension, chose a narrow  component based on medial and   lateral dimension.  The size 4 rotation block was then pinned in   position anterior referenced using the C-clamp to set rotation.  The   anterior, posterior, and  chamfer cuts were made without difficulty nor   notching making certain that I was along the anterior cortex to help   with flexion gap stability.      The final box cut was made off the lateral aspect of distal femur.      At this point, the tibia was sized to be a size 4, the size 4 tray was   then pinned in position through the medial third of the tubercle,   drilled, and keel punched.  Trial reduction was now carried with a 4 femur,  4 tibia, a size 7 mm insert, and the 35 patella botton.  The knee was brought to   extension, full extension with good flexion stability with the patella   tracking through the trochlea without application of pressure.  Given   all these findings, the trial components removed.  Final components were   opened and cement was mixed.  The knee was irrigated with normal saline   solution and pulse lavage.  The synovial lining was   then injected with 30 cc of 0.25% Marcaine with epinephrine and 1 cc of Toradol plus 30 cc of NS for a   total of 61 cc.      The knee was irrigated.  Final implants were then cemented onto clean and   dried cut surfaces of bone with the knee brought to extension with a size 7 mm trial insert.      Once the cement had fully cured, the excess cement was removed   throughout the knee.  I confirmed I was satisfied with the range of   motion and stability, and the final size 7 mm PS AOX insert was chosen.  It was   placed into the knee.      The tourniquet had been let down at 30 minutes.  No significant  hemostasis required.  The   extensor mechanism was then reapproximated using #1 Vicryl and #0 V-lock sutures with the knee   in flexion.  The   remaining wound was closed with 2-0 Vicryl and running 4-0 Monocryl.   The knee was cleaned,  dried, dressed sterilely using Dermabond and   Aquacel dressing.  The patient was then   brought to recovery room in stable condition, tolerating the procedure   well.   Please note that Physician Assistant, Danae Orleans, PA-C, was present for the entirety of the case, and was utilized for pre-operative positioning, peri-operative retractor management, general facilitation of the procedure.  He was also utilized for primary wound closure at the end of the case.              Pietro Cassis Alvan Dame, M.D.    05/08/2015 1:13 PM

## 2015-05-08 NOTE — Op Note (Signed)
NAME:  Amy Beck                      MEDICAL RECORD NO.:  [id number]                             FACILITY:  [facility]      PHYSICIAN:  Pietro Cassis. Alvan Dame, M.D.  DATE OF BIRTH:  [birth year]      DATE OF PROCEDURE:  05/08/2015                                     OPERATIVE REPORT         PREOPERATIVE DIAGNOSIS:  Right knee osteoarthritis.      POSTOPERATIVE DIAGNOSIS:  Right knee osteoarthritis.      FINDINGS:  The patient was noted to have complete loss of cartilage and   bone-on-bone arthritis with associated osteophytes in all three compartments of   the knee with a significant synovitis and associated effusion.      PROCEDURE:  Right total knee replacement.      COMPONENTS USED:  DePuy Attune rotating platform posterior stabilized knee   system, a size 5N femur, 4 tibia, size 5 mm insert, and 32 patellar   button.      SURGEON:  Pietro Cassis. Alvan Dame, M.D.      ASSISTANT:  Danae Orleans, PA-C.      ANESTHESIA:  Spinal.      SPECIMENS:  None.      COMPLICATION:  None.      DRAINS:  None.  EBL: <100cc      TOURNIQUET TIME:   Total Tourniquet Time Documented: Thigh (Left) - 30 minutes Total: Thigh (Left) - 30 minutes  Thigh (Right) - 46 minutes Total: Thigh (Right) - 46 minutes  .      The patient was stable to the recovery room.      INDICATION FOR PROCEDURE:  Amy Beck is a 54 y.o. female patient of   mine.  The patient had been seen, evaluated, and treated conservatively in the   office with medication, activity modification, and injections.  The patient had   radiographic changes of bone-on-bone arthritis with endplate sclerosis and osteophytes noted.      The patient failed conservative measures including medication, injections, and activity modification, and at this point was ready for more definitive measures.   Based on the radiographic changes and failed conservative measures, the patient   decided to proceed with total knee replacement.  Risks of  infection,   DVT, component failure, need for revision surgery, postop course, and   expectations were all   discussed and reviewed.  Consent was obtained for benefit of pain   relief.      PROCEDURE IN DETAIL:  The patient was brought to the operative theater.   Once adequate anesthesia, preoperative antibiotics, 2 gm of Ancef, 1 gm of Tranexamic Acid, and 10 mg of Decadron (for both knees) administered, the patient was positioned supine with bilateral thigh tourniquets placed.  The  rigth lower extremity was prepped and draped in sterile fashion.  A time-   out was performed identifying the patient, planned procedure, and   extremity.      The right lower extremity was placed in the [facility] leg holder.  The leg was   exsanguinated, tourniquet elevated to 250 mmHg.  A midline incision was   made followed by median parapatellar arthrotomy.  Following initial   exposure, attention was first directed to the patella.  Precut   measurement was noted to be 21 mm.  I resected down to 14 mm and used a   32 patellar button to restore patellar height as well as cover the cut   surface.      The lug holes were drilled and a metal shim was placed to protect the   patella from retractors and saw blades.      At this point, attention was now directed to the femur.  The femoral   canal was opened with a drill, irrigated to try to prevent fat emboli.  An   intramedullary rod was passed at 3 degrees valgus, 9 mm of bone was   resected off the distal femur.  Following this resection, the tibia was   subluxated anteriorly.  Using the extramedullary guide, 2 mm of bone was resected off   the proximal lateral tibia.  We confirmed the gap would be   stable medially and laterally with a size 5 mm insert as well as confirmed   the cut was perpendicular in the coronal plane, checking with an alignment rod.      Once this was done, I sized the femur to be a size 5 in the anterior-   posterior dimension, chose  a narrow component based on medial and   lateral dimension.  The size 5 rotation block was then pinned in   position anterior referenced using the C-clamp to set rotation.  The   anterior, posterior, and  chamfer cuts were made without difficulty nor   notching making certain that I was along the anterior cortex to help   with flexion gap stability.      The final box cut was made off the lateral aspect of distal femur.      At this point, the tibia was sized to be a size 4, the size 4 tray was   then pinned in position through the medial third of the tubercle,   drilled, and keel punched.  Trial reduction was now carried with a 5 femur,  4 tibia, a size 5 mm insert, and the 32 patella botton.  The knee was brought to   extension, full extension with good flexion stability with the patella   tracking through the trochlea without application of pressure.  Given   all these findings, the trial components removed.  Final components were   opened and cement was mixed.  The knee was irrigated with normal saline   solution and pulse lavage.  The synovial lining was   then injected with one half of a mixture of 30 cc of 0.25% Marcaine with epinephrine and 1 cc of Toradol plus 30 cc of NS for a  total of 61 cc.      The knee was irrigated.  Final implants were then cemented onto clean and   dried cut surfaces of bone with the knee brought to extension with a size 5 mm trial insert.      Once the cement had fully cured, the excess cement was removed   throughout the knee.  I confirmed I was satisfied with the range of   motion and stability, and the final size 5 mm PS AOX insert was chosen.  It was   placed into the knee.      The tourniquet had been let down  at 45 minutes.  No significant   hemostasis required.  The   extensor mechanism was then reapproximated using #1 Vicryl and # 0 V-lock sutures with the knee   in flexion.  The   remaining wound was closed with 2-0 Vicryl and running 4-0  Monocryl.   The knee was cleaned, dried, dressed sterilely using Dermabond and   Aquacel dressing.  The patient was then   brought to recovery room in stable condition, tolerating the procedure   well.   Please note that Physician Assistant, Danae Orleans, PA-C, was present for the entirety of the case, and was utilized for pre-operative positioning, peri-operative retractor management, general facilitation of the procedure.  He was also utilized for primary wound closure at the end of the case.              Pietro Cassis Alvan Dame, M.D.    05/08/2015 1:20 PM

## 2015-05-08 NOTE — Anesthesia Preprocedure Evaluation (Signed)
Anesthesia Evaluation  Patient identified by MRN, date of birth, ID band Patient awake    Reviewed: Allergy & Precautions, H&P , NPO status , Patient's Chart, lab work & pertinent test results  History of Anesthesia Complications Negative for: history of anesthetic complications  Airway Mallampati: II  TM Distance: >3 FB Neck ROM: Full    Dental no notable dental hx. (+) Dental Advisory Given, Teeth Intact   Pulmonary neg pulmonary ROS,    Pulmonary exam normal breath sounds clear to auscultation       Cardiovascular Exercise Tolerance: Good + Peripheral Vascular Disease  negative cardio ROS Normal cardiovascular exam Rhythm:Regular Rate:Normal     Neuro/Psych Raynaud's disease negative neurological ROS  negative psych ROS   GI/Hepatic negative GI ROS, Neg liver ROS,   Endo/Other  negative endocrine ROS  Renal/GU negative Renal ROS  negative genitourinary   Musculoskeletal  (+) Arthritis , Osteoarthritis,  Raynauds   Abdominal   Peds negative pediatric ROS (+)  Hematology negative hematology ROS (+)   Anesthesia Other Findings   Reproductive/Obstetrics negative OB ROS                             Anesthesia Physical Anesthesia Plan  ASA: II  Anesthesia Plan: Spinal   Post-op Pain Management:    Induction:   Airway Management Planned:   Additional Equipment:   Intra-op Plan:   Post-operative Plan:   Informed Consent: I have reviewed the patients History and Physical, chart, labs and discussed the procedure including the risks, benefits and alternatives for the proposed anesthesia with the patient or authorized representative who has indicated his/her understanding and acceptance.   Dental Advisory Given  Plan Discussed with: CRNA and Surgeon  Anesthesia Plan Comments:         Anesthesia Quick Evaluation

## 2015-05-08 NOTE — Anesthesia Procedure Notes (Signed)
Spinal Patient location during procedure: OR Start time: 05/08/2015 10:10 AM End time: 05/08/2015 10:24 AM Staffing Resident/CRNA: Jackston O[place]aca Performed by: resident/CRNA  Preanesthetic Checklist Completed: patient identified, site marked, surgical consent, pre-op evaluation, timeout performed, IV checked, risks and benefits discussed and monitors and equipment checked Spinal Block Patient position: sitting Prep: Betadine Patient monitoring: heart rate, cardiac monitor, continuous pulse ox and blood pressure Approach: midline Location: L3-4 Injection technique: single-shot Needle Needle type: Spinocan and Sprotte  Needle gauge: 24 G Needle length: 10 cm Needle insertion depth: 5 cm Assessment Sensory level: T6 Additional Notes -heme -para, VSS.  Exp date OK.

## 2015-05-08 NOTE — Plan of Care (Signed)
Problem: Consults Goal: Diagnosis- Total Joint Replacement Primary Total Knee     

## 2015-05-08 NOTE — Interval H&P Note (Signed)
History and Physical Interval Note:  05/08/2015 9:35 AM  Amy Beck  has presented today for surgery, with the diagnosis of bilateral knee osteoarthritis  The various methods of treatment have been discussed with the patient and family. After consideration of risks, benefits and other options for treatment, the patient has consented to  Procedure(s): TOTAL KNEE BILATERAL (Bilateral) as a surgical intervention .  The patient's history has been reviewed, patient examined, no change in status, stable for surgery.  I have reviewed the patient's chart and labs.  Questions were answered to the patient's satisfaction.     [name]

## 2015-05-08 NOTE — Transfer of Care (Signed)
Immediate Anesthesia Transfer of Care Note  Patient: Amy Beck  Procedure(s) Performed: Procedure(s): TOTAL KNEE BILATERAL (Bilateral)  Patient Location: PACU  Anesthesia Type:Spinal  Level of Consciousness:  sedated, patient cooperative and responds to stimulation  Airway & Oxygen Therapy:Patient Spontanous Breathing and Patient connected to face mask oxgen  Post-op Assessment:  Report given to PACU RN and Post -op Vital signs reviewed and stable  Post vital signs:  Reviewed and stable  Last Vitals:  Filed Vitals:   05/08/15 0730  BP: 153/97  Pulse: 73  Temp: 36.7 C  Resp: 16    Complications: No apparent anesthesia complications

## 2015-05-09 LAB — BASIC METABOLIC PANEL
Anion gap: 8 (ref 5–15)
BUN: 24 mg/dL — ABNORMAL HIGH (ref 6–20)
CHLORIDE: 100 mmol/L — AB (ref 101–111)
CO2: 26 mmol/L (ref 22–32)
Calcium: 8.5 mg/dL — ABNORMAL LOW (ref 8.9–10.3)
Creatinine, Ser: 0.48 mg/dL (ref 0.44–1.00)
GFR calc non Af Amer: >60 mL/min (ref >60)
Glucose, Bld: 126 mg/dL — ABNORMAL HIGH (ref 65–99)
POTASSIUM: 4 mmol/L (ref 3.5–5.1)
SODIUM: 134 mmol/L — AB (ref 135–145)

## 2015-05-09 LAB — CBC
HEMATOCRIT: 33.4 % — AB (ref 36.0–46.0)
HEMOGLOBIN: 11 g/dL — AB (ref 12.0–15.0)
MCH: 31.1 pg (ref 26.0–34.0)
MCHC: 32.9 g/dL (ref 30.0–36.0)
MCV: 94.4 fL (ref 78.0–100.0)
Platelets: 237 10*3/uL (ref 150–400)
RBC: 3.54 MIL/uL — ABNORMAL LOW (ref 3.87–5.11)
RDW: 13.7 % (ref 11.5–15.5)
WBC: 8.8 10*3/uL (ref 4.0–10.5)

## 2015-05-09 MED ORDER — HYDROMORPHONE HCL 2 MG PO TABS
2.0000 mg | ORAL_TABLET | ORAL | Status: DC
  Administered 2015-05-09 – 2015-05-10 (×6): 6 mg via ORAL
  Filled 2015-05-09 (×2): qty 3
  Filled 2015-05-09: qty 2
  Filled 2015-05-09 (×3): qty 3
  Filled 2015-05-09: qty 1

## 2015-05-09 NOTE — Evaluation (Signed)
Physical Therapy Evaluation Patient Details Name: Amy Beck MRN: [id number] DOB: Jun 10, 1961 Today's Date: 05/09/2015   History of Present Illness  status bilateral TKA with hx of R THA  Clinical Impression  Pt admitted with above diagnosis. Pt currently with functional limitations due to the deficits listed below (see PT Problem List). Pt will benefit from skilled PT to increase their independence and safety with mobility to allow discharge to the venue listed below.  Pt unsure of D/C plan, husband able to assist but not at home 24/7; will continue to follow, HHPT vs SNF depending on progress     Follow Up Recommendations Home health PT;SNF (depending on progress)    Equipment Recommendations  None recommended by PT    Recommendations for Other Services       Precautions / Restrictions Precautions Precautions: Knee;Fall Restrictions Weight Bearing Restrictions: Yes RLE Weight Bearing: Weight bearing as tolerated LLE Weight Bearing: Weight bearing as tolerated      Mobility  Bed Mobility Overal bed mobility: Needs Assistance       Supine to sit: Min guard     General bed mobility comments: for lines and safety, min guard for trunk  Transfers Overall transfer level: Needs assistance Equipment used: Rolling walker (2 wheeled) Transfers: Sit to/from Stand Sit to Stand: Min assist;+2 safety/equipment;+2 physical assistance;From elevated surface         General transfer comment: verbal cues for hand placcement and LE positioning  Ambulation/Gait Ambulation/Gait assistance: Min assist;+2 safety/equipment;+2 physical assistance Ambulation Distance (Feet): 4 Feet Assistive device: Rolling walker (2 wheeled) Gait Pattern/deviations: Step-to pattern;Wide base of support;Decreased step length - left;Decreased step length - right;Antalgic Gait velocity: decreased    General Gait Details: verbal cues for posture, position, and safety with walker  Stairs             Wheelchair Mobility    Modified Rankin (Stroke Patients Only)       Balance Overall balance assessment: Needs assistance Sitting-balance support: Feet supported;No upper extremity supported Sitting balance-Leahy Scale: Fair     Standing balance support: Bilateral upper extremity supported;During functional activity Standing balance-Leahy Scale: Poor                               Pertinent Vitals/Pain Pain Assessment: 0-10 Pain Score: 8  Pain Location: bilateral knees Pain Descriptors / Indicators: Aching;Sore Pain Intervention(s): Limited activity within patient's tolerance;Monitored during session;Premedicated before session;Repositioned;Ice applied    Home Living Family/patient expects to be discharged to:: Unsure Living Arrangements: Spouse/significant other Available Help at Discharge: Available PRN/intermittently Type of Home: House Home Access: Stairs to enter Entrance Stairs-Rails: Right Entrance Stairs-Number of Steps: 3 Home Layout: Two level;Able to live on main level with bedroom/bathroom   Additional Comments: Pt unsure of d/c plan, husband not available 24/7    Prior Function Level of Independence: Independent               Hand Dominance        Extremity/Trunk Assessment   Upper Extremity Assessment: Defer to OT evaluation;Overall WFL for tasks assessed           Lower Extremity Assessment: RLE deficits/detail;LLE deficits/detail RLE Deficits / Details: ankle WFL, knee extension strength 2+/5, hip flexion 2/5 LLE Deficits / Details: ankle WFL, knee extension strength 2-/5, hip flexion 2/5  Cervical / Trunk Assessment: Normal  Communication   Communication: No difficulties  Cognition Arousal/Alertness: Awake/alert Behavior During Therapy: WFL for  tasks assessed/performed Overall Cognitive Status: Within Functional Limits for tasks assessed                      General Comments      Exercises Total Joint  Exercises Ankle Circles/Pumps: AROM;Both;10 reps Quad Sets: AROM;Both;10 reps Heel Slides: AROM;Both;10 reps      Assessment/Plan    PT Assessment Patient needs continued PT services  PT Diagnosis Difficulty walking;Acute pain   PT Problem List Decreased strength;Decreased range of motion;Decreased activity tolerance;Decreased mobility;Decreased balance;Decreased knowledge of use of DME;Decreased knowledge of precautions;Pain  PT Treatment Interventions DME instruction;Gait training;Stair training;Functional mobility training;Therapeutic activities;Therapeutic exercise;Patient/family education   PT Goals (Current goals can be found in the Care Plan section) Acute Rehab PT Goals Patient Stated Goal: Pt wants to be able to navigate stairs again and walk without pain  PT Goal Formulation: With patient Time For Goal Achievement: 05/13/15 Potential to Achieve Goals: Good    Frequency 7X/week   Barriers to discharge        Co-evaluation               End of Session Equipment Utilized During Treatment: Gait belt Activity Tolerance: Patient limited by pain;Patient tolerated treatment well Patient left: in chair;with call bell/phone within reach           Time: 1051-1110 PT Time Calculation (min) (ACUTE ONLY): 19 min   Charges:   PT Evaluation $Initial PT Evaluation Tier I: 1 Procedure     PT G Codes:        Lauree Yurick 2015-05-15, 1:10 PM

## 2015-05-09 NOTE — Care Management Note (Signed)
Case Management Note  Patient Details  Name: Amy Beck MRN: [id number] Date of Birth: Dec 15, 1960  Subjective/Objective:                   TOTAL KNEE BILATERAL (Bilateral) Action/Plan: Discharge planning  Expected Discharge Date:  05/10/15               Expected Discharge Plan:  [place]  In-House Referral:     Discharge planning Services  CM Consult  Post Acute Care Choice:  Home Health Choice offered to:  Patient  DME Arranged:  N/A DME Agency:  NA  HH Arranged:  PT HH Agency:  [place]  Status of Service:  Completed, signed off  Medicare Important Message Given:    Date Medicare IM Given:    Medicare IM give by:    Date Additional Medicare IM Given:    Additional Medicare Important Message give by:     If discussed at [place] of Stay Meetings, dates discussed:    Additional Comments: Utilization Review complete.  CM met with pt in room to offer choice of home health agency.  Pt chooses [facility] to render HHPT.  Pt has DME from last surgery.  Referral given to [facility] rep, Tim (on unit).  No other CM needs were communicated. Dellie Catholic, RN 05/09/2015, 3:03 PM

## 2015-05-09 NOTE — Progress Notes (Signed)
Physical Therapy Treatment Patient Details Name: Amy Beck MRN: [id number] DOB: 1960-12-12 Today's Date: 05/09/2015    History of Present Illness status bilateral TKA with hx of R THA    PT Comments    Pt is motivated,  tolerated exercises well and progresses toward her goals. Continue ambulation and exercises per POC.    Follow Up Recommendations  Home health PT;SNF (depending on progress)     Equipment Recommendations  None recommended by PT    Recommendations for Other Services       Precautions / Restrictions Precautions Precautions: Fall;Knee Restrictions Weight Bearing Restrictions: No RLE Weight Bearing: Weight bearing as tolerated LLE Weight Bearing: Weight bearing as tolerated    Mobility  Bed Mobility Overal bed mobility: Needs Assistance       Supine to sit: Min guard     General bed mobility comments: for lines and safety, min guard for trunk  Transfers Overall transfer level: Needs assistance Equipment used: Rolling walker (2 wheeled) Transfers: Sit to/from Stand Sit to Stand: Min assist;+2 safety/equipment;+2 physical assistance;From elevated surface         General transfer comment: verbal cues for hand placcement and LE positioning  Ambulation/Gait Ambulation/Gait assistance: Min assist;+2 physical assistance;+2 safety/equipment Ambulation Distance (Feet): 15 Feet Assistive device: Rolling walker (2 wheeled) Gait Pattern/deviations: Step-to pattern;Wide base of support;Decreased step length - left;Decreased step length - right Gait velocity: decreased    General Gait Details: verbal cues for posture, position, and safety with walker   Stairs            Wheelchair Mobility    Modified Rankin (Stroke Patients Only)       Balance Overall balance assessment: Needs assistance Sitting-balance support: Feet supported;No upper extremity supported Sitting balance-Leahy Scale: Fair     Standing balance support: Bilateral  upper extremity supported;During functional activity Standing balance-Leahy Scale: Poor                      Cognition Arousal/Alertness: Awake/alert Behavior During Therapy: WFL for tasks assessed/performed Overall Cognitive Status: Within Functional Limits for tasks assessed                      Exercises Total Joint Exercises Ankle Circles/Pumps: AROM;Both;20 reps Quad Sets: Strengthening;Both;10 reps Towel Squeeze: Strengthening;Both;10 reps Short Arc Quad: Strengthening;Both;10 reps Heel Slides: AROM;Both;10 reps Hip ABduction/ADduction: AROM;Both;10 reps Straight Leg Raises: AAROM;Both;10 reps    General Comments        Pertinent Vitals/Pain Pain Assessment: 0-10 Pain Score: 7  Pain Location: bilateral knees Pain Descriptors / Indicators: Aching;Sore Pain Intervention(s): Limited activity within patient's tolerance;Monitored during session;Repositioned;Premedicated before session;Ice applied    Home Living                      Prior Function            PT Goals (current goals can now be found in the care plan section) Acute Rehab PT Goals Patient Stated Goal: Pt wants to be able to navigate stairs again and walk without pain  Progress towards PT goals: Progressing toward goals    Frequency  7X/week    PT Plan Current plan remains appropriate    Co-evaluation             End of Session Equipment Utilized During Treatment: Gait belt Activity Tolerance: Patient limited by pain;Patient tolerated treatment well Patient left: in chair;with call bell/phone within reach     Time:  2956-2130 PT Time Calculation (min) (ACUTE ONLY): 38  Charges:                       G Codes:      Ana Steed 2015-05-24, 4:43 PM

## 2015-05-09 NOTE — Progress Notes (Signed)
Patient ID: Amy Beck, female   DOB: [birth year], 54 y.o.   MRN: [id number] Subjective: 1 Day Post-Op Procedure(s) (LRB): TOTAL KNEE BILATERAL (Bilateral)    Patient reports pain as moderate.  Worked through the night to get comfortable.  IV Dilaudid helped the most  Objective:   VITALS:   Filed Vitals:   05/09/15 0628  BP: 134/89  Pulse: 76  Temp: 97.7 F (36.5 C)  Resp: 14    Neurovascular intact Incision: dressing C/D/I, bilaterally  LABS  Recent Labs  05/09/15 0504  HGB 11.0*  HCT 33.4*  WBC 8.8  PLT 237     Recent Labs  05/09/15 0504  NA 134*  K 4.0  BUN 24*  CREATININE 0.48  GLUCOSE 126*    No results for input(s): LABPT, INR in the last 72 hours.   Assessment/Plan: 1 Day Post-Op Procedure(s) (LRB): TOTAL KNEE BILATERAL (Bilateral)   Advance diet Up with therapy Plan for discharge tomorrow or Thursday depending on PT progress and ability to get pain managed Increase PO Dilaudid to 2-6 mg q4hr and add tylenol 1000mg  q8hr  Can still use IV Dilaudid today as we get through the initial strain of the post-operative pain Otherwise great attitude, ready to take on challenge of recovery

## 2015-05-09 NOTE — Discharge Instructions (Addendum)
Information on my medicine - XARELTO (Rivaroxaban)  This medication education was reviewed with me or my healthcare representative as part of my discharge preparation.  The pharmacist that spoke with me during my hospital stay was:  Glogovac,nikola, [facility]  Why was Xarelto prescribed for you? Xarelto was prescribed for you to reduce the risk of blood clots forming after orthopedic surgery. The medical term for these abnormal blood clots is venous thromboembolism (VTE).  What do you need to know about xarelto ? Take your Xarelto ONCE DAILY at the same time every day. You may take it either with or without food.  If you have difficulty swallowing the tablet whole, you may crush it and mix in applesauce just prior to taking your dose.  Take Xarelto exactly as prescribed by your doctor and DO NOT stop taking Xarelto without talking to the doctor who prescribed the medication.  Stopping without other VTE prevention medication to take the place of Xarelto may increase your risk of developing a clot.  After discharge, you should have regular check-up appointments with your healthcare provider that is prescribing your Xarelto.    What do you do if you miss a dose? If you miss a dose, take it as soon as you remember on the same day then continue your regularly scheduled once daily regimen the next day. Do not take two doses of Xarelto on the same day.   Important Safety Information A possible side effect of Xarelto is bleeding. You should call your healthcare provider right away if you experience any of the following: ? Bleeding from an injury or your nose that does not stop. ? Unusual colored urine (red or dark brown) or unusual colored stools (red or black). ? Unusual bruising for unknown reasons. ? A serious fall or if you hit your head (even if there is no bleeding).  Some medicines may interact with Xarelto and might increase your risk of bleeding while on Xarelto. To help avoid  this, consult your healthcare provider or pharmacist prior to using any new prescription or non-prescription medications, including herbals, vitamins, non-steroidal anti-inflammatory drugs (NSAIDs) and supplements.  This website has more information on Xarelto: [email or web address]-benson.[email or web address]     __________________________________________________________________________________________________________________________________________    INSTRUCTIONS AFTER JOINT REPLACEMENT   o Remove items at home which could result in a fall. This includes throw rugs or furniture in walking pathways o ICE to the affected joint every three hours while awake for 30 minutes at a time, for at least the first 3-5 days, and then as needed for pain and swelling.  Continue to use ice for pain and swelling. You may notice swelling that will progress down to the foot and ankle.  This is normal after surgery.  Elevate your leg when you are not up walking on it.   o Continue to use the breathing machine you got in the hospital (incentive spirometer) which will help keep your temperature down.  It is common for your temperature to cycle up and down following surgery, especially at night when you are not up moving around and exerting yourself.  The breathing machine keeps your lungs expanded and your temperature down.   DIET:  As you were doing prior to hospitalization, we recommend a well-balanced diet.  DRESSING / WOUND CARE / SHOWERING  Keep the surgical dressing until follow up.  The dressing is water proof, so you can shower without any extra covering.  IF THE DRESSING FALLS OFF or the wound gets wet inside, change the  dressing with sterile gauze.  Please use good hand washing techniques before changing the dressing.  Do not use any lotions or creams on the incision until instructed by your surgeon.    ACTIVITY  o Increase activity slowly as tolerated, but follow the weight bearing instructions below.   o No driving for 6 weeks  or until further direction given by your physician.  You cannot drive while taking narcotics.  o No lifting or carrying greater than 10 lbs. until further directed by your surgeon. o Avoid periods of inactivity such as sitting longer than an hour when not asleep. This helps prevent blood clots.  o You may return to work once you are authorized by your doctor.     WEIGHT BEARING   Weight bearing as tolerated with assist device (walker, cane, etc) as directed, use it as long as suggested by your surgeon or therapist, typically at least 4-6 weeks.   EXERCISES  Results after joint replacement surgery are often greatly improved when you follow the exercise, range of motion and muscle strengthening exercises prescribed by your doctor. Safety measures are also important to protect the joint from further injury. Any time any of these exercises cause you to have increased pain or swelling, decrease what you are doing until you are comfortable again and then slowly increase them. If you have problems or questions, call your caregiver or physical therapist for advice.   Rehabilitation is important following a joint replacement. After just a few days of immobilization, the muscles of the leg can become weakened and shrink (atrophy).  These exercises are designed to build up the tone and strength of the thigh and leg muscles and to improve motion. Often times heat used for twenty to thirty minutes before working out will loosen up your tissues and help with improving the range of motion but do not use heat for the first two weeks following surgery (sometimes heat can increase post-operative swelling).   These exercises can be done on a training (exercise) mat, on the floor, on a table or on a bed. Use whatever works the best and is most comfortable for you.    Use music or television while you are exercising so that the exercises are a pleasant break in your day. This will make your life better with the  exercises acting as a break in your routine that you can look forward to.   Perform all exercises about fifteen times, three times per day or as directed.  You should exercise both the operative leg and the other leg as well.  Exercises include:    Quad Sets - Tighten up the muscle on the front of the thigh (Quad) and hold for 5-10 seconds.    Straight Leg Raises - With your knee straight (if you were given a brace, keep it on), lift the leg to 60 degrees, hold for 3 seconds, and slowly lower the leg.  Perform this exercise against resistance later as your leg gets stronger.   Leg Slides: Lying on your back, slowly slide your foot toward your buttocks, bending your knee up off the floor (only go as far as is comfortable). Then slowly slide your foot back down until your leg is flat on the floor again.   Angel Wings: Lying on your back spread your legs to the side as far apart as you can without causing discomfort.   Hamstring Strength:  Lying on your back, push your heel against the floor with your leg  straight by tightening up the muscles of your buttocks.  Repeat, but this time bend your knee to a comfortable angle, and push your heel against the floor.  You may put a pillow under the heel to make it more comfortable if necessary.   A rehabilitation program following joint replacement surgery can speed recovery and prevent re-injury in the future due to weakened muscles. Contact your doctor or a physical therapist for more information on knee rehabilitation.    CONSTIPATION  Constipation is defined medically as fewer than three stools per week and severe constipation as less than one stool per week.  Even if you have a regular bowel pattern at home, your normal regimen is likely to be disrupted due to multiple reasons following surgery.  Combination of anesthesia, postoperative narcotics, change in appetite and fluid intake all can affect your bowels.   YOU MUST use at least one of the  following options; they are listed in order of increasing strength to get the job done.  They are all available over the counter, and you may need to use some, POSSIBLY even all of these options:    Drink plenty of fluids (prune juice may be helpful) and high fiber foods Colace 100 mg by mouth twice a day  Senokot for constipation as directed and as needed Dulcolax (bisacodyl), take with full glass of water  Miralax (polyethylene glycol) once or twice a day as needed.  If you have tried all these things and are unable to have a bowel movement in the first 3-4 days after surgery call either your surgeon or your primary doctor.    If you experience loose stools or diarrhea, hold the medications until you stool forms back up.  If your symptoms do not get better within 1 week or if they get worse, check with your doctor.  If you experience "the worst abdominal pain ever" or develop nausea or vomiting, please contact the office immediately for further recommendations for treatment.   ITCHING:  If you experience itching with your medications, try taking only a single pain pill, or even half a pain pill at a time.  You can also use Benadryl over the counter for itching or also to help with sleep.   TED HOSE STOCKINGS:  Use stockings on both legs until for at least 2 weeks or as directed by physician office. They may be removed at night for sleeping.  MEDICATIONS:  See your medication summary on the After Visit Summary that nursing will review with you.  You may have some home medications which will be placed on hold until you complete the course of blood thinner medication.  It is important for you to complete the blood thinner medication as prescribed.  PRECAUTIONS:  If you experience chest pain or shortness of breath - call 911 immediately for transfer to the hospital emergency department.   If you develop a fever greater that 101 F, purulent drainage from wound, increased redness or drainage from  wound, foul odor from the wound/dressing, or calf pain - CONTACT YOUR SURGEON.                                                   FOLLOW-UP APPOINTMENTS:  If you do not already have a post-op appointment, please call the office for an appointment to be seen by your  Psychologist, sport and exercise.  Guidelines for how soon to be seen are listed in your After Visit Summary, but are typically between 1-4 weeks after surgery.  OTHER INSTRUCTIONS:   Knee Replacement:  Do not place pillow under knee, focus on keeping the knee straight while resting.   MAKE SURE YOU:   Understand these instructions.   Get help right away if you are not doing well or get worse.    Thank you for letting us be a part of your medical care team.  It is a privilege we respect greatly.  We hope these instructions will help you stay on track for a fast and full recovery!

## 2015-05-10 LAB — CBC
HEMATOCRIT: 31.3 % — AB (ref 36.0–46.0)
Hemoglobin: 10.5 g/dL — ABNORMAL LOW (ref 12.0–15.0)
MCH: 31.7 pg (ref 26.0–34.0)
MCHC: 33.5 g/dL (ref 30.0–36.0)
MCV: 94.6 fL (ref 78.0–100.0)
Platelets: 240 10*3/uL (ref 150–400)
RBC: 3.31 MIL/uL — ABNORMAL LOW (ref 3.87–5.11)
RDW: 14 % (ref 11.5–15.5)
WBC: 6.3 10*3/uL (ref 4.0–10.5)

## 2015-05-10 LAB — BASIC METABOLIC PANEL
ANION GAP: 7 (ref 5–15)
BUN: 12 mg/dL (ref 6–20)
CALCIUM: 8.8 mg/dL — AB (ref 8.9–10.3)
CO2: 27 mmol/L (ref 22–32)
Chloride: 103 mmol/L (ref 101–111)
Creatinine, Ser: 0.55 mg/dL (ref 0.44–1.00)
GFR calc Af Amer: >60 mL/min (ref >60)
GFR calc non Af Amer: >60 mL/min (ref >60)
GLUCOSE: 105 mg/dL — AB (ref 65–99)
Potassium: 4.1 mmol/L (ref 3.5–5.1)
Sodium: 137 mmol/L (ref 135–145)

## 2015-05-10 MED ORDER — METHOCARBAMOL 500 MG PO TABS: 500.0000 mg | ORAL_TABLET | Freq: Four times a day (QID) | ORAL | Status: DC | PRN

## 2015-05-10 MED ORDER — HYDROMORPHONE HCL 2 MG PO TABS: 2.0000 mg | ORAL_TABLET | ORAL | Status: DC | PRN

## 2015-05-10 MED ORDER — ACETAMINOPHEN 325 MG PO TABS: 650.0000 mg | ORAL_TABLET | Freq: Four times a day (QID) | ORAL | Status: DC | PRN

## 2015-05-10 MED ORDER — RIVAROXABAN 10 MG PO TABS: 10.0000 mg | ORAL_TABLET | ORAL | Status: DC

## 2015-05-10 NOTE — Progress Notes (Signed)
     Subjective: 2 Days Post-Op Procedure(s) (LRB): TOTAL KNEE BILATERAL (Bilateral)   Patient reports pain as mild, pain controlled well.  Hasn't need any dilaudid for the last 2 scheduled doses. No events throughout the night.  Ready to be discharged home.   Objective:   VITALS:   Filed Vitals:   05/10/15 0526  BP: 123/78  Pulse: 81  Temp: 100.3 F (37.9 C)  Resp: 16    Dorsiflexion/Plantar flexion intact Incision: dressing C/D/I No cellulitis present Compartment soft  LABS  Recent Labs  05/09/15 0504 05/10/15 0540  HGB 11.0* 10.5*  HCT 33.4* 31.3*  WBC 8.8 6.3  PLT 237 240     Recent Labs  05/09/15 0504 05/10/15 0540  NA 134* 137  K 4.0 4.1  BUN 24* 12  CREATININE 0.48 0.55  GLUCOSE 126* 105*     Assessment/Plan: 2 Days Post-Op Procedure(s) (LRB): TOTAL KNEE BILATERAL (Bilateral) Up with therapy Discharge home with home health  Follow up in 2 weeks at [facility]. Follow up with OLIN,Caidynce Muzyka D in 2 weeks.  Contact information:  [facility] [address] [place] [phone]    Overweight (BMI 25-29.9) Estimated body mass index is 28.31 kg/(m^2) as calculated from the following:   Height as of this encounter: 5\' 4"  (1.626 m).   Weight as of this encounter: 74.844 kg (165 lb). Patient also counseled that weight may inhibit the healing process Patient counseled that losing weight will help with future health issues        West Pugh. Ethelyne Erich   PAC  05/10/2015, 9:55 AM

## 2015-05-10 NOTE — Plan of Care (Signed)
Problem: Consults Goal: Diagnosis- Total Joint Replacement Outcome: Completed/Met Date Met:  05/10/15 Primary Total Knee BILATERAL  Problem: Phase III Progression Outcomes Goal: Anticoagulant follow-up in place Outcome: Not Applicable Date Met:  15/86/82 Xarelto VTE, no f/u needed.

## 2015-05-10 NOTE — Evaluation (Signed)
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  She will benefit from skilled OT to further assess DME needs for bathroom and further educate on bathroom transfers.  Her husband will assist with adls as needed.  Goals are for min guard    Follow Up Recommendations  Supervision/Assistance - 24 hour;SNF (vs.)    Equipment Recommendations   (to be further assessed:  pt hopes not to need 3:1)    Recommendations for Other Services       Precautions / Restrictions Precautions Precautions: Fall;Knee Restrictions Weight Bearing Restrictions: No RLE Weight Bearing: Weight bearing as tolerated LLE Weight Bearing: Weight bearing as tolerated      Mobility Bed Mobility                  Transfers Overall transfer level: Needs assistance Equipment used: Rolling walker (2 wheeled) Transfers: Sit to/from Stand Sit to Stand: Min assist;From elevated surface         General transfer comment: steadying assistance    Balance                                            ADL Overall ADL's : Needs assistance/impaired     Grooming: Wash/dry hands;Min guard;Standing   Upper Body Bathing: Set up;Sitting   Lower Body Bathing: Minimal assistance;Sit to/from stand   Upper Body Dressing : Set up;Sitting   Lower Body Dressing: Moderate assistance;Sit to/from stand   Toilet Transfer: Minimal assistance;Ambulation;BSC;RW             General ADL Comments: ambulated to bathroom:  pt hopes to be able to use handicapped height commode at home without DME:  nothing to push up from next to commode. Will try this on next visit.  She has a very small ledge for shower and plans to sponge bathe until she can do a standing shower     Vision      Perception     Praxis      Pertinent Vitals/Pain Pain Assessment: 0-10 Pain Score: 8  (R knee; 5 L knee) Pain Descriptors / Indicators: Aching Pain Intervention(s): Limited activity within patient's tolerance;Monitored during session;Premedicated before session;Repositioned;Ice applied     Hand Dominance     Extremity/Trunk Assessment Upper Extremity Assessment Upper Extremity Assessment: Overall WFL for tasks assessed           Communication Communication Communication: No difficulties   Cognition Arousal/Alertness: Awake/alert Behavior During Therapy: WFL for tasks assessed/performed Overall Cognitive Status: Within Functional Limits for tasks assessed                     General Comments       Exercises       Shoulder Instructions      Home Living Family/patient expects to be discharged to:: Unsure Living Arrangements: Spouse/significant other Available Help at Discharge: Available PRN/intermittently:  Husband is retired               Civil engineer, contracting Shower/Tub: Occupational psychologist: Handicapped height                Prior Functioning/Environment Level of Independence: Independent  OT Diagnosis: Acute pain   OT Problem List: Decreased strength;Pain;Decreased knowledge of use of DME or AE   OT Treatment/Interventions: Self-care/ADL training;DME and/or AE instruction;Patient/family education    OT Goals(Current goals can be found in the care plan section) Acute Rehab OT Goals Patient Stated Goal: Pt wants to be able to navigate stairs again and walk without pain  OT Goal Formulation: With patient Time For Goal Achievement: 05/17/15 Potential to Achieve Goals: Good ADL Goals Pt Will Transfer to Toilet: with min guard assist;ambulating (high commode with RW ) Pt Will Perform Tub/Shower Transfer: Shower transfer;with min guard assist;ambulating (small ledge)  OT Frequency: Min 2X/week   Barriers to D/C:             Co-evaluation              End of Session    Activity Tolerance: Patient tolerated treatment well Patient left: in chair;with call bell/phone within reach   Time: 0842-0905 OT Time Calculation (min): 23 min Charges:  OT General Charges $OT Visit: 1 Procedure OT Evaluation $Initial OT Evaluation Tier I: 1 Procedure OT Treatments $Self Care/Home Management : 8-22 mins G-Codes:    Safi Culotta 2015/06/05, 10:08 AM  [name], OTR/L [phone] 06/05/2015

## 2015-05-10 NOTE — Progress Notes (Signed)
Discharge instructions reviewed with pt and husband who is at bedside. Prescriptions reviewed as well. Pt has no questions. Pt understands all instructions. IV discontinued. Pt getting dressed and then will be discharged. Pt alert and oriented at discharge and in NAD.

## 2015-05-10 NOTE — Progress Notes (Signed)
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 Will continue therapy at home with HHPT. She has husband at home PRN to help with ADLs. Ready to be d/c.   Follow Up Recommendations  Home health PT     Equipment Recommendations  None recommended by PT    Recommendations for Other Services       Precautions / Restrictions Precautions Precautions: Fall;Knee Restrictions Weight Bearing Restrictions: No RLE Weight Bearing: Weight bearing as tolerated LLE Weight Bearing: Weight bearing as tolerated    Mobility  Bed Mobility Overal bed mobility: Needs Assistance Bed Mobility: Supine to Sit;Sit to Supine     Supine to sit: Min guard Sit to supine: Min guard   General bed mobility comments: ming guard foe safety and LE positioning  Transfers Overall transfer level: Needs assistance Equipment used: Rolling walker (2 wheeled) Transfers: Sit to/from Stand Sit to Stand: Min assist;From elevated surface         General transfer comment: steadying assistance  Ambulation/Gait Ambulation/Gait assistance: +2 safety/equipment;Min assist Ambulation Distance (Feet): 30 Feet Assistive device: Rolling walker (2 wheeled) Gait Pattern/deviations: Step-to pattern;Decreased step length - right;Decreased step length - left;Wide base of support Gait velocity: decreased    General Gait Details: VC's for posture, position, gait pattern, and safety with walker   Stairs Stairs: Yes Stairs assistance: +2 safety/equipment, min assist Stair Management: Backwards;With walker Number of Stairs: 4 General stair comments: multimodal cues for safety, husband will help navigate stairs at home  Wheelchair Mobility    Modified Rankin (Stroke Patients Only)        Balance Overall balance assessment: Needs assistance Sitting-balance support: No upper extremity supported;Feet supported Sitting balance-Leahy Scale: Fair     Standing balance support: Bilateral upper extremity supported;During functional activity Standing balance-Leahy Scale: Poor                      Cognition Arousal/Alertness: Awake/alert Behavior During Therapy: WFL for tasks assessed/performed Overall Cognitive Status: Within Functional Limits for tasks assessed                      Exercises Total Joint Exercises Ankle Circles/Pumps: AROM;Both;20 reps;Supine Quad Sets: Strengthening;Both;15 reps;Supine Heel Slides: AROM;Both;10 reps;Supine    General Comments        Pertinent Vitals/Pain Pain Assessment: 0-10 Pain Score: 6  Pain Location: bilat knees Pain Descriptors / Indicators: Aching Pain Intervention(s): Limited activity within patient's tolerance;Monitored during session;RN gave pain meds during session;Ice applied;Repositioned    Home Living              Prior Function           PT Goals (current goals can now be found in the care plan section) Acute Rehab PT Goals Patient Stated Goal: Pt wants to be able to navigate stairs again and walk without pain  PT Goal Formulation: With patient Time For Goal Achievement: 05/13/15 Potential to Achieve Goals: Good Progress towards PT goals: Progressing toward goals    Frequency  7X/week    PT Plan Current plan remains appropriate    Co-evaluation             End of Session Equipment Utilized During Treatment: Gait belt Activity Tolerance: Patient tolerated  treatment well;Patient limited by pain Patient left: in bed;with call bell/phone within reach     Time: 1159-1223 PT Time Calculation (min) (ACUTE ONLY): 24 min  Charges:                       G Codes:     Note reviewed [name], PT Pager: [phone] 05/10/2015  [name] 05/10/2015, 12:39 PM

## 2015-05-16 NOTE — Discharge Summary (Signed)
Physician Discharge Summary  Patient ID: Amy Beck MRN: [id number] DOB/AGE: 01-02-1961 54 y.o.  Admit date: 05/08/2015 Discharge date: 05/10/2015   Procedures:  Procedure(s) (LRB): TOTAL KNEE BILATERAL (Bilateral)  Attending Physician:  Dr. Paralee Cancel   Admission Diagnoses:   Bilateral knee primary OA / pain  Discharge Diagnoses:  Active Problems:   Overweight (BMI 25.0-29.9)   S/P bilateral TKAs  Past Medical History  Diagnosis Date  . Allergy   . Hyperlipidemia   . Raynaud's disease   . Arthritis   . Chigger bites     HPI:    Amy Beck, 54 y.o. female, has a history of pain and functional disability in the bilaterally knee due to arthritis and has failed non-surgical conservative treatments for greater than 12 weeks to include NSAID's and/or analgesics, use of assistive devices and activity modification. Onset of symptoms was gradual, starting 5+ years ago with gradually worsening course since that time. The patient noted prior procedures on the knee to include arthroscopy on the bilaterally knee(s). Patient currently rates pain in the bilaterally knee(s) at 9 out of 10 with activity. Patient has night pain, worsening of pain with activity and weight bearing, pain that interferes with activities of daily living, pain with passive range of motion, crepitus and joint swelling. Patient has evidence of periarticular osteophytes and joint space narrowing by imaging studies. There is no active infection. Risks, benefits and expectations were discussed with the patient. Risks including but not limited to the risk of anesthesia, blood clots, nerve damage, blood vessel damage, failure of the prosthesis, infection and up to and including death. Patient understand the risks, benefits and expectations and wishes to proceed with surgery.   PCP: Finis Bud, MD   Discharged Condition: good  Hospital Course:  Patient underwent the above stated procedure on  05/08/2015. Patient tolerated the procedure well and brought to the recovery room in good condition and subsequently to the floor.  POD #1 BP: 134/89 ; Pulse: 76 ; Temp: 97.7 F (36.5 C) ; Resp: 14 Patient reports pain as moderate. Worked through the night to get comfortable. IV Dilaudid helped the most. Neurovascular intact and incision: dressing C/D/I, bilaterally  LABS  Basename    HGB  11.0  HCT  33.4   POD #2  BP: 123/78 ; Pulse: 81 ; Temp: 100.3 F (37.9 C) ; Resp: 16 Patient reports pain as mild, pain controlled well. Hasn't need any dilaudid for the last 2 scheduled doses. No events throughout the night. Ready to be discharged home.  Bilaterally - dorsiflexion/plantar flexion intact, incision: dressing C/D/I, no cellulitis present and compartment soft.   LABS  Basename    HGB  10.5  HCT  31.3    Discharge Exam: General appearance: alert, cooperative and no distress Extremities: Homans sign is negative, no sign of DVT, no edema, redness or tenderness in the calves or thighs and no ulcers, gangrene or trophic changes  Disposition: Home with follow up in 2 weeks   Follow-up Information    Follow up with [name], MD. Schedule an appointment as soon as possible for a visit in 2 weeks.   Specialty:  Orthopedic Surgery   Contact information:   [address] [place] [address] [phone]       Discharge Instructions    Call MD / Call 911    Complete by:  As directed   If you experience chest pain or shortness of breath, CALL 911 and be transported to  the hospital emergency room.  If you develope a fever above 101 F, pus (white drainage) or increased drainage or redness at the wound, or calf pain, call your surgeon's office.     Change dressing    Complete by:  As directed   Maintain surgical dressing until follow up in the clinic. If the edges start to pull up, may reinforce with tape. If the dressing is no longer working, may remove and  cover with gauze and tape, but must keep the area dry and clean.  Call with any questions or concerns.     Constipation Prevention    Complete by:  As directed   Drink plenty of fluids.  Prune juice may be helpful.  You may use a stool softener, such as Colace (over the counter) 100 mg twice a day.  Use MiraLax (over the counter) for constipation as needed.     Diet - low sodium heart healthy    Complete by:  As directed      Discharge instructions    Complete by:  As directed   Maintain surgical dressing until follow up in the clinic. If the edges start to pull up, may reinforce with tape. If the dressing is no longer working, may remove and cover with gauze and tape, but must keep the area dry and clean.  Follow [facility] in [facility] at [facility]. Call with any questions or concerns.     Increase activity slowly as tolerated    Complete by:  As directed   Weight bearing as tolerated with assist device (walker, cane, etc) as directed, use it as long as suggested by your surgeon or therapist, typically at least 4-6 weeks.     TED hose    Complete by:  As directed   Use stockings (TED hose) for 2 weeks on both leg(s).  You may remove them at night for sleeping.             Medication List    STOP taking these medications        HYDROcodone-acetaminophen 7.5-325 MG tablet  Commonly known as:  NORCO     traMADol 50 MG tablet  Commonly known as:  ULTRAM      TAKE these medications        acetaminophen 325 MG tablet  Commonly known as:  TYLENOL  Take 2 tablets (650 mg total) by mouth every 6 (six) hours as needed for mild pain (or Fever >/= 101).     celecoxib 100 MG capsule  Commonly known as:  CELEBREX  Take 200 mg by mouth daily.     cetirizine 10 MG tablet  Commonly known as:  ZYRTEC  Take 10 mg by mouth daily as needed for allergies.     docusate sodium 100 MG capsule  Commonly known as:  COLACE  Take 1 capsule (100 mg total) by mouth 2 (two) times daily.      EVENING PRIMROSE OIL PO  Take 1 tablet by mouth daily.     ferrous sulfate 325 (65 FE) MG tablet  Take 1 tablet (325 mg total) by mouth 3 (three) times daily after meals.     glucosamine-chondroitin 500-400 MG tablet  Take 2 tablets by mouth daily.     HAIR/SKIN/NAILS PO  Take 1 tablet by mouth daily.     HYDROmorphone 2 MG tablet  Commonly known as:  DILAUDID  Take 1-3 tablets (2-6 mg total) by mouth every 4 (four) hours as needed for  severe pain.     KRILL OIL PO  Take 1 capsule by mouth daily.     magnesium oxide 400 MG tablet  Commonly known as:  MAG-OX  Take 400 mg by mouth daily.     methocarbamol 500 MG tablet  Commonly known as:  ROBAXIN  Take 1 tablet (500 mg total) by mouth every 6 (six) hours as needed for muscle spasms.     NON FORMULARY  Take 80 mg by mouth daily. Black cohash     oxymetazoline 0.05 % nasal spray  Commonly known as:  AFRIN  Place 1 spray into both nostrils 2 (two) times daily as needed for congestion.     polyethylene glycol packet  Commonly known as:  MIRALAX / GLYCOLAX  Take 17 g by mouth 2 (two) times daily.     rivaroxaban 10 MG Tabs tablet  Commonly known as:  XARELTO  Take 1 tablet (10 mg total) by mouth daily.     simvastatin 20 MG tablet  Commonly known as:  ZOCOR  Take 20 mg by mouth daily.         Signed: [place] Pugh. Aquarius Tremper   PA-C  05/16/2015, 12:03 PM

## 2016-07-06 IMAGING — CR DG HIP (WITH OR WITHOUT PELVIS) 1V PORT*L*
1 series · 2 of 2 positions shown · non-contrast
Comparison: None.

CLINICAL DATA: Postop total left hip replacement

EXAM:
LEFT HIP (WITH PELVIS) 1 VIEW PORTABLE

[Series 1: AP · left · 2 of 2 slices shown]
[im 1/2]
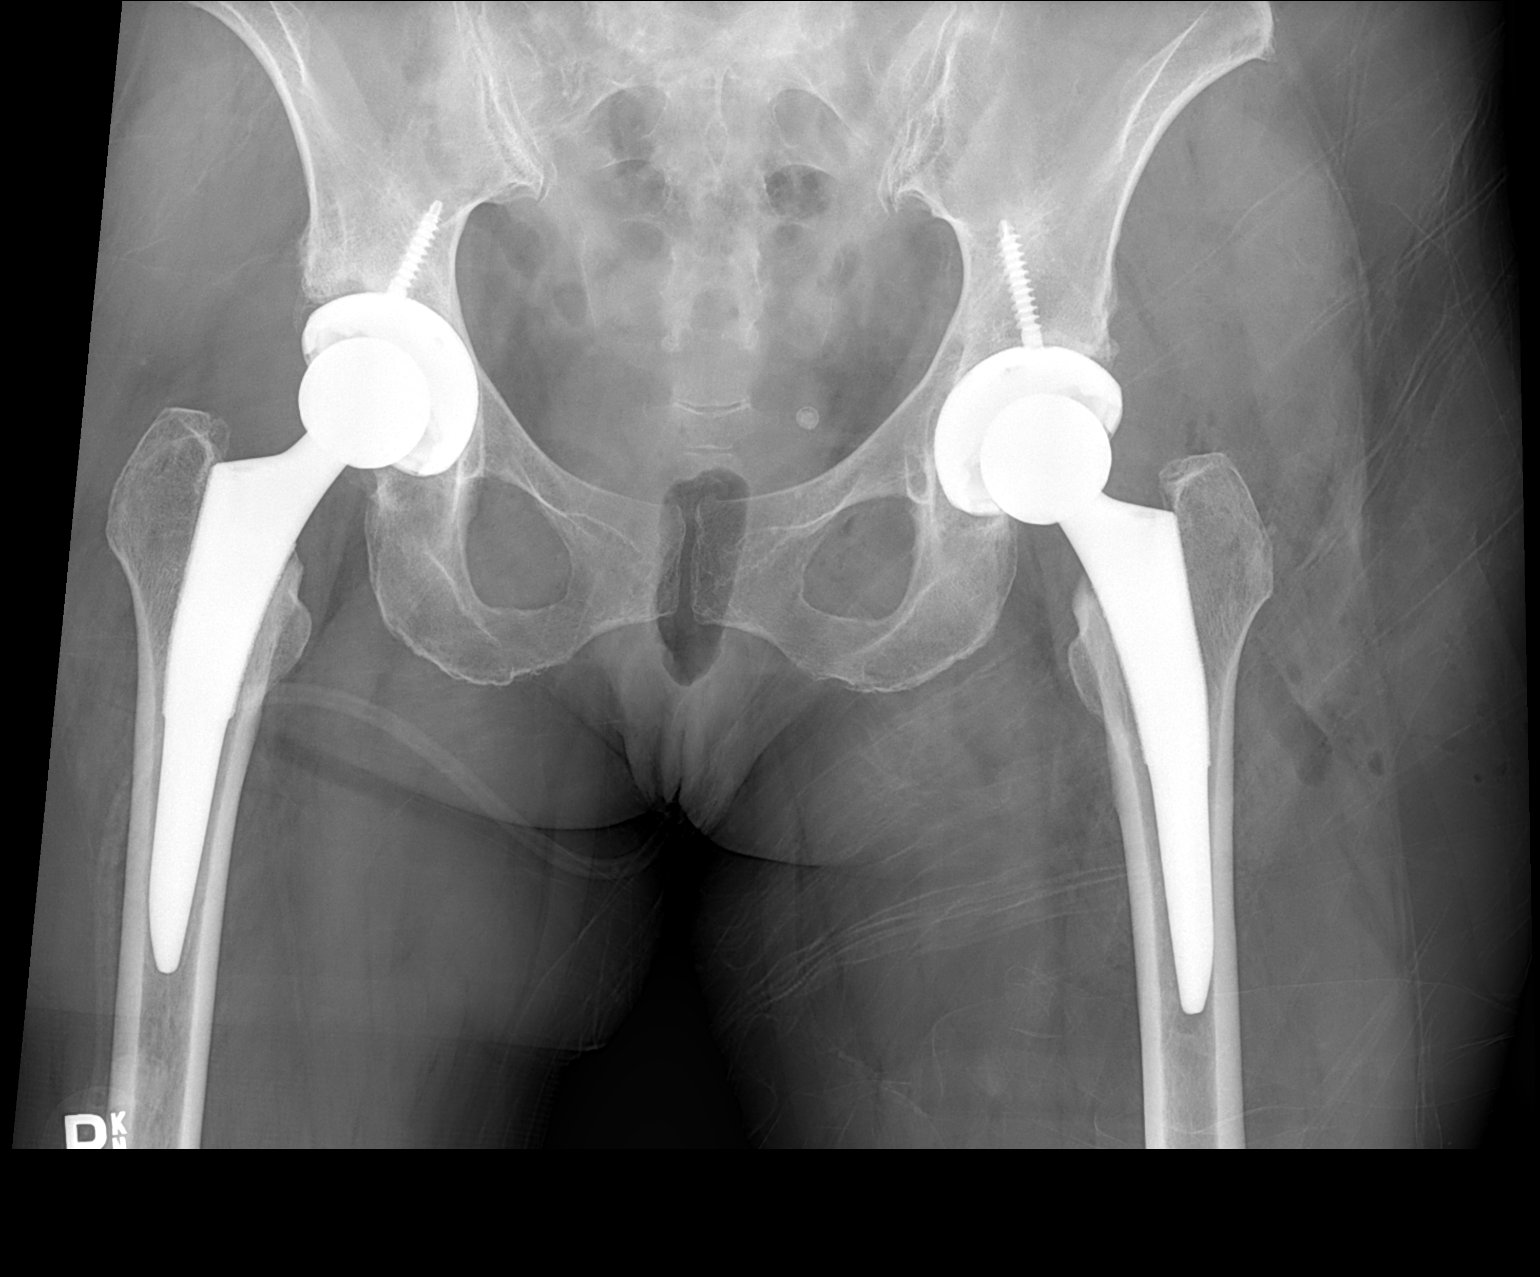
[im 2/2]
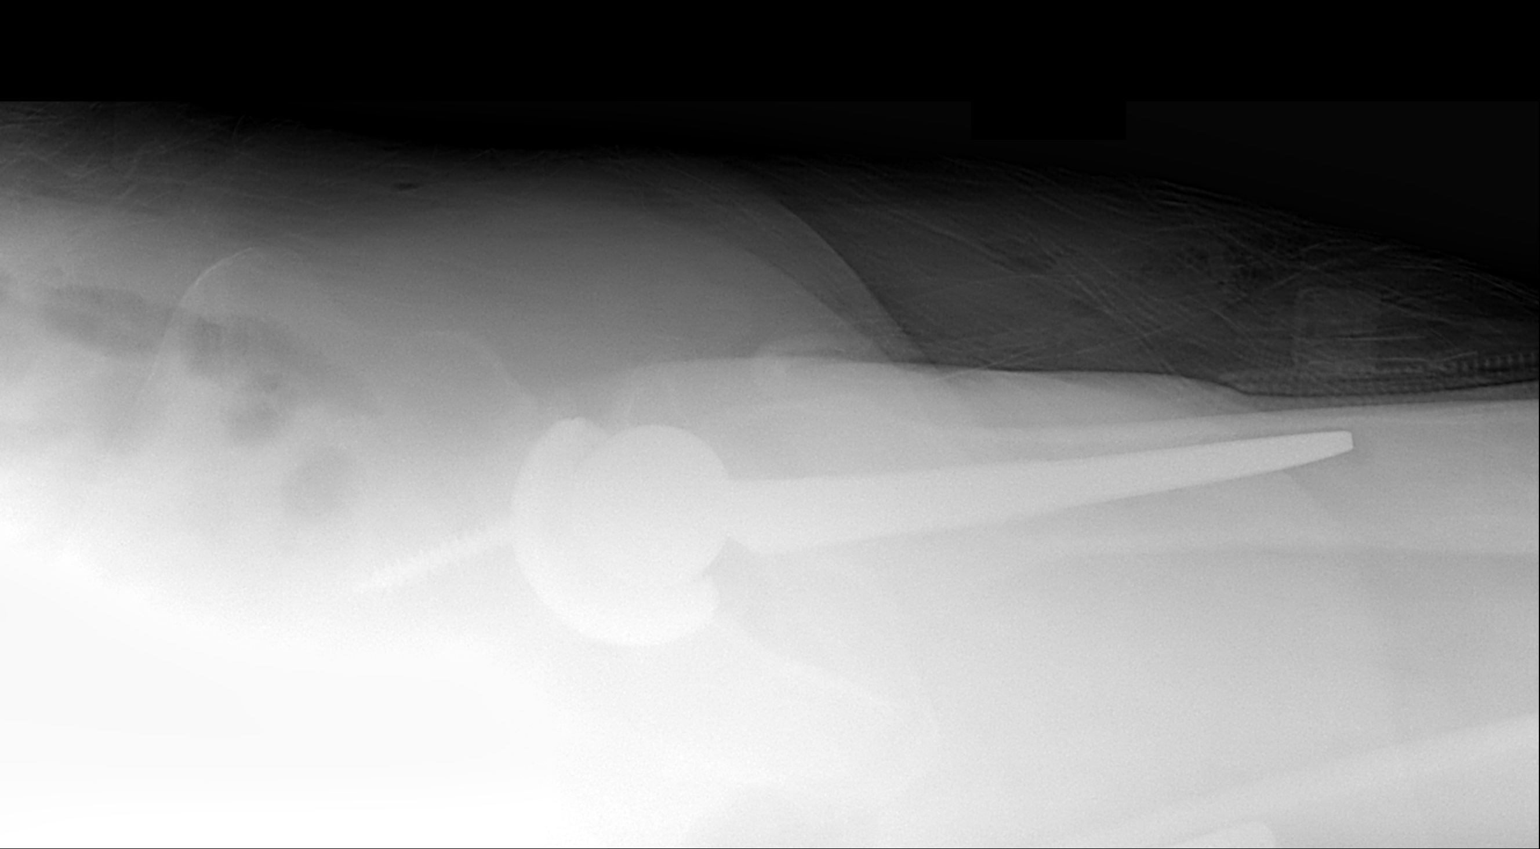

[2 of 2 positions shown; findings below may reference images not displayed]

FINDINGS: Bilateral hip total arthroplasty, both well-seated. Changes of
recent surgery on the left including soft tissue gas. There is no
evidence of fracture or dislocation.
IMPRESSION: Unremarkable bilateral total hip arthroplasty, new on the left.

## 2016-08-14 ENCOUNTER — Encounter: Payer: Self-pay | Admitting: Gastroenterology

## 2016-10-03 ENCOUNTER — Encounter: Payer: Self-pay | Admitting: Gastroenterology

## 2016-11-06 ENCOUNTER — Encounter: Payer: Self-pay | Admitting: Gastroenterology

## 2016-11-06 ENCOUNTER — Ambulatory Visit (AMBULATORY_SURGERY_CENTER): Payer: Self-pay | Admitting: *Deleted

## 2016-11-06 VITALS — Ht 65.0 in | Wt 174.0 lb

## 2016-11-06 DIAGNOSIS — Z86010 Personal history of colonic polyps: Secondary | ICD-10-CM

## 2016-11-06 MED ORDER — MOVIPREP 100 G PO SOLR: 1.0000 | Freq: Once | ORAL | 0 refills | Status: AC

## 2016-11-06 NOTE — Progress Notes (Signed)
Patient denies any allergies to eggs or soy. Patient denies any problems with anesthesia/sedation. Patient denies any oxygen use at home and does not take any diet/weight loss medications. EMMI education assisgned to patient on colonoscopy, this was explained and instructions given to patient. Suprep is contraindicated due to patient's magnesium sulfate allergy, so MoviPrep given to patient. Patient had MoviPrep in 2013 without any problems. Explained this to patient.

## 2016-11-20 ENCOUNTER — Ambulatory Visit (AMBULATORY_SURGERY_CENTER): Payer: BLUE CROSS/BLUE SHIELD | Admitting: Gastroenterology

## 2016-11-20 ENCOUNTER — Encounter: Payer: Self-pay | Admitting: Gastroenterology

## 2016-11-20 VITALS — BP 134/88 | HR 66 | Temp 98.6°F | Resp 17 | Ht 65.0 in | Wt 174.0 lb

## 2016-11-20 DIAGNOSIS — Z860100 Personal history of colon polyps, unspecified: Secondary | ICD-10-CM

## 2016-11-20 DIAGNOSIS — K621 Rectal polyp: Secondary | ICD-10-CM

## 2016-11-20 DIAGNOSIS — D128 Benign neoplasm of rectum: Secondary | ICD-10-CM

## 2016-11-20 DIAGNOSIS — D120 Benign neoplasm of cecum: Secondary | ICD-10-CM

## 2016-11-20 MED ORDER — SODIUM CHLORIDE 0.9 % IV SOLN: 500.0000 mL | INTRAVENOUS | Status: AC

## 2016-11-20 NOTE — Progress Notes (Signed)
Called to room to assist during endoscopic procedure.  Patient ID and intended procedure confirmed with present staff. Received instructions for my participation in the procedure from the performing physician.  

## 2016-11-20 NOTE — Patient Instructions (Signed)
YOU HAD AN ENDOSCOPIC PROCEDURE TODAY AT THE [facility]:   Refer to the procedure report that was given to you for any specific questions about what was found during the examination.  If the procedure report does not answer your questions, please call your gastroenterologist to clarify.  If you requested that your care partner not be given the details of your procedure findings, then the procedure report has been included in a sealed envelope for you to review at your convenience later.  YOU SHOULD EXPECT: Some feelings of bloating in the abdomen. Passage of more gas than usual.  Walking can help get rid of the air that was put into your GI tract during the procedure and reduce the bloating. If you had a lower endoscopy (such as a colonoscopy or flexible sigmoidoscopy) you may notice spotting of blood in your stool or on the toilet paper. If you underwent a bowel prep for your procedure, you may not have a normal bowel movement for a few days.  Please Note:  You might notice some irritation and congestion in your nose or some drainage.  This is from the oxygen used during your procedure.  There is no need for concern and it should clear up in a day or so.  SYMPTOMS TO REPORT IMMEDIATELY:   Following lower endoscopy (colonoscopy or flexible sigmoidoscopy):  Excessive amounts of blood in the stool  Significant tenderness or worsening of abdominal pains  Swelling of the abdomen that is new, acute  Fever of 100F or higher   For urgent or emergent issues, a gastroenterologist can be reached at any hour by calling [phone].   DIET:  We do recommend a small meal at first, but then you may proceed to your regular diet.  Drink plenty of fluids but you should avoid alcoholic beverages for 24 hours.  ACTIVITY:  You should plan to take it easy for the rest of today and you should NOT DRIVE or use heavy machinery until tomorrow (because of the sedation medicines used during the test).     FOLLOW UP: Our staff will call the number listed on your records the next business day following your procedure to check on you and address any questions or concerns that you may have regarding the information given to you following your procedure. If we do not reach you, we will leave a message.  However, if you are feeling well and you are not experiencing any problems, there is no need to return our call.  We will assume that you have returned to your regular daily activities without incident.  If any biopsies were taken you will be contacted by phone or by letter within the next 1-3 weeks.  Please call us at [phone] if you have not heard about the biopsies in 3 weeks.    SIGNATURES/CONFIDENTIALITY: You and/or your care partner have signed paperwork which will be entered into your electronic medical record.  These signatures attest to the fact that that the information above on your After Visit Summary has been reviewed and is understood.  Full responsibility of the confidentiality of this discharge information lies with you and/or your care-partner.  Diverticulosis, polyp and hemorrhoid information given. 

## 2016-11-20 NOTE — Op Note (Signed)
[place] Patient Name: Amy Beck Procedure Date: 11/20/2016 10:01 AM MRN: [id number] Endoscopist: [name] , MD Age: 56 Referring MD:  Date of Birth: [birth year] Gender: Female Account #: [id number] Procedure:                Colonoscopy Indications:              High risk colon cancer surveillance: Personal                            history of colonic polyps, Surveillance: Personal                            history of adenomatous polyps on last colonoscopy 5                            years ago Medicines:                Monitored Anesthesia Care Procedure:                Pre-Anesthesia Assessment:                           - Prior to the procedure, a History and Physical                            was performed, and patient medications and                            allergies were reviewed. The patient's tolerance of                            previous anesthesia was also reviewed. The risks                            and benefits of the procedure and the sedation                            options and risks were discussed with the patient.                            All questions were answered, and informed consent                            was obtained. Prior Anticoagulants: The patient has                            taken no previous anticoagulant or antiplatelet                            agents. ASA Grade Assessment: II - A patient with                            mild systemic disease. After reviewing the risks  and benefits, the patient was deemed in                            satisfactory condition to undergo the procedure.                           After obtaining informed consent, the colonoscope                            was passed under direct vision. Throughout the                            procedure, the patient's blood pressure, pulse, and                            oxygen saturations were monitored continuously.  The                            Model CF-HQ190L [id number]) scope was introduced                            through the anus and advanced to the the cecum,                            identified by appendiceal orifice and ileocecal                            valve. The colonoscopy was performed without                            difficulty. The patient tolerated the procedure                            well. The quality of the bowel preparation was                            excellent. The ileocecal valve, appendiceal                            orifice, and rectum were photographed. Scope In: 10:06:59 AM Scope Out: 10:22:56 AM Scope Withdrawal Time: 0 hours 13 minutes 4 seconds  Total Procedure Duration: 0 hours 15 minutes 57 seconds  Findings:                 The perianal and digital rectal examinations were                            normal.                           Two sessile polyps were found in the cecum. The                            polyps were 4 to 5 mm in size. These polyps were  removed with a cold snare. Resection and retrieval                            were complete.                           Three sessile polyps were found in the rectum. The                            polyps were 1 to 2 mm in size. These polyps were                            removed with a cold biopsy forceps. Resection and                            retrieval were complete.                           Multiple small and large-mouthed diverticula were                            found in the sigmoid colon, descending colon,                            transverse colon and ascending colon.                           Non-bleeding internal hemorrhoids were found during                            retroflexion. The hemorrhoids were small. Complications:            No immediate complications. Estimated Blood Loss:     Estimated blood loss was minimal. Impression:               - Two 4 to  5 mm polyps in the cecum, removed with a                            cold snare. Resected and retrieved.                           - Three 1 to 2 mm polyps in the rectum, removed                            with a cold biopsy forceps. Resected and retrieved.                           - Diverticulosis in the sigmoid colon, in the                            descending colon, in the transverse colon and in                            the ascending colon.                           -  Non-bleeding internal hemorrhoids. Recommendation:           - Patient has a contact number available for                            emergencies. The signs and symptoms of potential                            delayed complications were discussed with the                            patient. Return to normal activities tomorrow.                            Written discharge instructions were provided to the                            patient.                           - Resume previous diet.                           - Continue present medications.                           - Await pathology results.                           - Repeat colonoscopy in 5 years for surveillance                            based on pathology results. [name], MD 11/20/2016 10:27:32 AM This report has been signed electronically.

## 2016-11-20 NOTE — Progress Notes (Signed)
To PACU, vss patent aw report to rn 

## 2016-11-21 ENCOUNTER — Telehealth: Payer: Self-pay

## 2016-11-21 NOTE — Telephone Encounter (Signed)
   Follow up Call-  Call back number 11/20/2016  Post procedure Call Back phone  # [phone]  Permission to leave phone message Yes  Some recent data might be hidden    Left message

## 2016-11-21 NOTE — Telephone Encounter (Signed)
   Follow up Call-  Call back number 11/20/2016  Post procedure Call Back phone  # [phone]  Permission to leave phone message Yes  Some recent data might be hidden     Left message

## 2016-11-27 ENCOUNTER — Encounter: Payer: Self-pay | Admitting: Gastroenterology

## 2022-01-12 ENCOUNTER — Encounter: Payer: Self-pay | Admitting: Gastroenterology
